# Patient Record
Sex: Male | Born: 1955 | Race: White | Hispanic: No | Marital: Married | Smoking: Never smoker
Health system: Southern US, Community
[De-identification: ages and names within clinical notes are randomized; demographics above are authoritative.]

## PROBLEM LIST (undated history)

## (undated) DIAGNOSIS — K219 Gastro-esophageal reflux disease without esophagitis: Secondary | ICD-10-CM

## (undated) DIAGNOSIS — E785 Hyperlipidemia, unspecified: Secondary | ICD-10-CM

## (undated) DIAGNOSIS — I10 Essential (primary) hypertension: Secondary | ICD-10-CM

## (undated) DIAGNOSIS — I82409 Acute embolism and thrombosis of unspecified deep veins of unspecified lower extremity: Secondary | ICD-10-CM

## (undated) DIAGNOSIS — M1990 Unspecified osteoarthritis, unspecified site: Secondary | ICD-10-CM

## (undated) DIAGNOSIS — I2699 Other pulmonary embolism without acute cor pulmonale: Secondary | ICD-10-CM

## (undated) DIAGNOSIS — G4730 Sleep apnea, unspecified: Secondary | ICD-10-CM

## (undated) DIAGNOSIS — E7800 Pure hypercholesterolemia, unspecified: Secondary | ICD-10-CM

## (undated) HISTORY — DX: Other pulmonary embolism without acute cor pulmonale: I26.99

## (undated) HISTORY — PX: COLONOSCOPY: SHX174

## (undated) HISTORY — PX: CATARACT EXTRACTION: SUR2

## (undated) HISTORY — PX: OTHER SURGICAL HISTORY: SHX169

## (undated) HISTORY — DX: Pure hypercholesterolemia, unspecified: E78.00

## (undated) HISTORY — DX: Morbid (severe) obesity due to excess calories: E66.01

## (undated) HISTORY — PX: WISDOM TOOTH EXTRACTION: SHX21

## (undated) HISTORY — DX: Unspecified osteoarthritis, unspecified site: M19.90

## (undated) HISTORY — DX: Acute embolism and thrombosis of unspecified deep veins of unspecified lower extremity: I82.409

---

## 2013-04-03 DIAGNOSIS — M25551 Pain in right hip: Secondary | ICD-10-CM

## 2013-04-03 HISTORY — DX: Pain in right hip: M25.551

## 2015-03-07 ENCOUNTER — Other Ambulatory Visit: Payer: Self-pay | Admitting: Orthopaedic Surgery

## 2015-03-29 ENCOUNTER — Ambulatory Visit (HOSPITAL_COMMUNITY)
Admission: RE | Admit: 2015-03-29 | Discharge: 2015-03-29 | Disposition: A | Discharge status: Home / Self Care | Payer: BLUE CROSS/BLUE SHIELD | Source: Ambulatory Visit | Attending: Orthopaedic Surgery | Admitting: Orthopaedic Surgery

## 2015-03-29 ENCOUNTER — Encounter (HOSPITAL_COMMUNITY): Payer: Self-pay

## 2015-03-29 ENCOUNTER — Encounter (HOSPITAL_COMMUNITY)
Admission: RE | Admit: 2015-03-29 | Discharge: 2015-03-29 | Disposition: A | Discharge status: Home / Self Care | Payer: BLUE CROSS/BLUE SHIELD | Source: Ambulatory Visit | Attending: Orthopaedic Surgery | Admitting: Orthopaedic Surgery

## 2015-03-29 DIAGNOSIS — Z01810 Encounter for preprocedural cardiovascular examination: Secondary | ICD-10-CM | POA: Diagnosis not present

## 2015-03-29 DIAGNOSIS — Z01818 Encounter for other preprocedural examination: Secondary | ICD-10-CM

## 2015-03-29 DIAGNOSIS — Z01812 Encounter for preprocedural laboratory examination: Secondary | ICD-10-CM | POA: Diagnosis not present

## 2015-03-29 HISTORY — DX: Hyperlipidemia, unspecified: E78.5

## 2015-03-29 HISTORY — DX: Unspecified osteoarthritis, unspecified site: M19.90

## 2015-03-29 HISTORY — DX: Gastro-esophageal reflux disease without esophagitis: K21.9

## 2015-03-29 HISTORY — DX: Essential (primary) hypertension: I10

## 2015-03-29 HISTORY — DX: Sleep apnea, unspecified: G47.30

## 2015-03-29 LAB — BASIC METABOLIC PANEL
Anion gap: 8 (ref 5–15)
BUN: 14 mg/dL (ref 6–20)
CALCIUM: 9.4 mg/dL (ref 8.9–10.3)
CO2: 26 mmol/L (ref 22–32)
CREATININE: 1.03 mg/dL (ref 0.61–1.24)
Chloride: 107 mmol/L (ref 101–111)
GFR calc non Af Amer: >60 mL/min (ref >60)
Glucose, Bld: 98 mg/dL (ref 65–99)
Potassium: 4.2 mmol/L (ref 3.5–5.1)
SODIUM: 141 mmol/L (ref 135–145)

## 2015-03-29 LAB — CBC WITH DIFFERENTIAL/PLATELET
BASOS PCT: 0 %
Basophils Absolute: 0 10*3/uL (ref 0.0–0.1)
EOS ABS: 0.2 10*3/uL (ref 0.0–0.7)
EOS PCT: 4 %
HCT: 39.8 % (ref 39.0–52.0)
HEMOGLOBIN: 12.8 g/dL — AB (ref 13.0–17.0)
Lymphocytes Relative: 35 %
Lymphs Abs: 2.1 10*3/uL (ref 0.7–4.0)
MCH: 28.8 pg (ref 26.0–34.0)
MCHC: 32.2 g/dL (ref 30.0–36.0)
MCV: 89.6 fL (ref 78.0–100.0)
MONOS PCT: 6 %
Monocytes Absolute: 0.4 10*3/uL (ref 0.1–1.0)
NEUTROS PCT: 55 %
Neutro Abs: 3.4 10*3/uL (ref 1.7–7.7)
PLATELETS: 231 10*3/uL (ref 150–400)
RBC: 4.44 MIL/uL (ref 4.22–5.81)
RDW: 13.6 % (ref 11.5–15.5)
WBC: 6.1 10*3/uL (ref 4.0–10.5)

## 2015-03-29 LAB — APTT: aPTT: 29 seconds (ref 24–37)

## 2015-03-29 LAB — SURGICAL PCR SCREEN
MRSA, PCR: NEGATIVE
Staphylococcus aureus: NEGATIVE

## 2015-03-29 LAB — PROTIME-INR
INR: 1.07 (ref 0.00–1.49)
PROTHROMBIN TIME: 14.1 s (ref 11.6–15.2)

## 2015-03-29 LAB — URINALYSIS, ROUTINE W REFLEX MICROSCOPIC
Bilirubin Urine: NEGATIVE
GLUCOSE, UA: NEGATIVE mg/dL
HGB URINE DIPSTICK: NEGATIVE
Ketones, ur: NEGATIVE mg/dL
LEUKOCYTES UA: NEGATIVE
Nitrite: NEGATIVE
PH: 7 (ref 5.0–8.0)
Protein, ur: NEGATIVE mg/dL
SPECIFIC GRAVITY, URINE: 1.023 (ref 1.005–1.030)

## 2015-03-29 LAB — ABO/RH: ABO/RH(D): A POS

## 2015-03-29 NOTE — Progress Notes (Signed)
PCP is Dr [facility] Denies ever seeing a cardiologist. Has never had a stress test, echo, or card cath. [facility] sent Request sent for sleep study- [place] [facility] [place]. Instructed pt to bring in Cpap mask on the day of surgery, voices understanding.

## 2015-03-29 NOTE — Pre-Procedure Instructions (Addendum)
Joseph Bruce  03/29/2015     No Pharmacies Listed   Your procedure is scheduled on Dec20  Report to [facility] Admitting at 800 A.M.  Call this number if you have problems the morning of surgery:  [phone]   Remember:  Do not eat food or drink liquids after midnight.  Take these medicines the morning of surgery with A SIP OF WATER   Stop taking Asprin, Ibuprofen,  Advil, Motrin, BC's, Goody's, Herbal medications, Fish oil   Do not wear jewelry, make-up or nail polish.  Do not wear lotions, powders, or perfumes.  You may wear deodorant.  Do not shave 48 hours prior to surgery.  Men may shave face and neck.  Do not bring valuables to the hospital.  [facility] is not responsible for any belongings or valuables.  Contacts, dentures or bridgework may not be worn into surgery.  Leave your suitcase in the car.  After surgery it may be brought to your room.  For patients admitted to the hospital, discharge time will be determined by your treatment team.  Patients discharged the day of surgery will not be allowed to drive home.    Special instructions: [place] - Preparing for Surgery  Before surgery, you can play an important role.  Because skin is not sterile, your skin needs to be as free of germs as possible.  You can reduce the number of germs on you skin by washing with CHG (chlorahexidine gluconate) soap before surgery.  CHG is an antiseptic cleaner which kills germs and bonds with the skin to continue killing germs even after washing.  Please DO NOT use if you have an allergy to CHG or antibacterial soaps.  If your skin becomes reddened/irritated stop using the CHG and inform your nurse when you arrive at Short Stay.  Do not shave (including legs and underarms) for at least 48 hours prior to the first CHG shower.  You may shave your face.  Please follow these instructions carefully:   1.  Shower with CHG Soap the night before surgery and the   morning  of Surgery.  2.  If you choose to wash your hair, wash your hair first as usual with your normal shampoo.  3.  After you shampoo, rinse your hair and body thoroughly to remove the  Shampoo.  4.  Use CHG as you would any other liquid soap.  You can apply chg directly   to the skin and wash gently with scrungie or a clean washcloth.  5.  Apply the CHG Soap to your body ONLY FROM THE NECK DOWN.     Do not use on open wounds or open sores.  Avoid contact with your eyes,  ears, mouth and genitals (private parts).  Wash genitals (private parts)  with your normal soap.  6.  Wash thoroughly, paying special attention to the area where your surgery   will be performed.  7.  Thoroughly rinse your body with warm water from the neck down.  8.  DO NOT shower/wash with your normal soap after using and rinsing off   the CHG Soap.  9.  Pat yourself dry with a clean towel.            10.  Wear clean pajamas.            11.  Place clean sheets on your bed the night of your first shower and do not sleep with pets.  Day  of Surgery  Do not apply any lotions/deoderants the morning of surgery.  Please wear clean clothes to the hospital/surgery center.     Please read over the following fact sheets that you were given. Pain Booklet, Coughing and Deep Breathing, MRSA Information and Surgical Site Infection Prevention

## 2015-03-30 LAB — TYPE AND SCREEN
ABO/RH(D): A POS
Antibody Screen: NEGATIVE

## 2015-04-03 NOTE — H&P (Signed)
TOTAL HIP ADMISSION H&P  Patient is admitted for right total hip arthroplasty.  Subjective:  Chief Complaint: right hip pain  HPI: Joseph Bruce, 59 y.o. male, has a history of pain and functional disability in the right hip(s) due to arthritis and patient has failed non-surgical conservative treatments for greater than 12 weeks to include NSAID's and/or analgesics, corticosteriod injections, flexibility and strengthening excercises, use of assistive devices, weight reduction as appropriate and activity modification.  Onset of symptoms was gradual starting 5 years ago with gradually worsening course since that time.The patient noted no past surgery on the right hip(s).  Patient currently rates pain in the right hip at 10 out of 10 with activity. Patient has night pain, worsening of pain with activity and weight bearing, trendelenberg gait, pain that interfers with activities of daily living and crepitus. Patient has evidence of subchondral cysts, subchondral sclerosis, periarticular osteophytes and joint space narrowing by imaging studies. This condition presents safety issues increasing the risk of falls. There is no current active infection.  There are no active problems to display for this patient.  Past Medical History  Diagnosis Date  . Hypertension   . Serum lipids high   . Sleep apnea   . Arthritis   . GERD (gastroesophageal reflux disease)     ulcer    Past Surgical History  Procedure Laterality Date  . Colonoscopy    . Wisdom tooth extraction      No prescriptions prior to admission   No Known Allergies  Social History  Substance Use Topics  . Smoking status: Never Smoker   . Smokeless tobacco: Not on file  . Alcohol Use: Yes     Comment: occ    No family history on file.   Review of Systems  Musculoskeletal: Positive for joint pain.       Right hip  All other systems reviewed and are negative.   Objective:  Physical Exam  Constitutional: He is oriented to  person, place, and time. He appears well-developed and well-nourished.  HENT:  Head: Normocephalic and atraumatic.  Eyes: Pupils are equal, round, and reactive to light.  Neck: Normal range of motion.  Cardiovascular: Normal rate and regular rhythm.   Respiratory: Effort normal.  GI: Soft.  Musculoskeletal:  Right hip motion is quite limited. He has pain on internal and external rotation. Leg lengths are roughly equal. Sensation and motor function are intact in his feet with palpable pulses on both sides. There is no palpable lymphadenopathy about his groin.  Neurological: He is alert and oriented to person, place, and time.  Skin: Skin is warm and dry.  Psychiatric: He has a normal mood and affect. His behavior is normal. Judgment and thought content normal.    Vital signs in last 24 hours:    Labs:   There is no height or weight on file to calculate BMI.   Imaging Review Plain radiographs demonstrate severe degenerative joint disease of the right hip(s). The bone quality appears to be good for age and reported activity level.  Assessment/Plan:  End stage primary arthritis, right hip(s)  The patient history, physical examination, clinical judgement of the provider and imaging studies are consistent with end stage degenerative joint disease of the right hip(s) and total hip arthroplasty is deemed medically necessary. The treatment options including medical management, injection therapy, arthroscopy and arthroplasty were discussed at length. The risks and benefits of total hip arthroplasty were presented and reviewed. The risks due to aseptic loosening, infection,  stiffness, dislocation/subluxation,  thromboembolic complications and other imponderables were discussed.  The patient acknowledged the explanation, agreed to proceed with the plan and consent was signed. Patient is being admitted for inpatient treatment for surgery, pain control, PT, OT, prophylactic antibiotics, VTE  prophylaxis, progressive ambulation and ADL's and discharge planning.The patient is planning to be discharged to skilled nursing facility

## 2015-04-08 MED ORDER — DEXTROSE 5 % IV SOLN
3.0000 g | INTRAVENOUS | Status: AC
  Administered 2015-04-09: 3 g via INTRAVENOUS
  Filled 2015-04-08: qty 3000

## 2015-04-08 MED ORDER — LACTATED RINGERS IV SOLN
INTRAVENOUS | Status: DC
  Administered 2015-04-09 (×4): via INTRAVENOUS

## 2015-04-08 MED ORDER — CHLORHEXIDINE GLUCONATE 4 % EX LIQD: 60.0000 mL | Freq: Once | CUTANEOUS | Status: DC

## 2015-04-09 ENCOUNTER — Inpatient Hospital Stay (HOSPITAL_COMMUNITY): Payer: BLUE CROSS/BLUE SHIELD | Admitting: Anesthesiology

## 2015-04-09 ENCOUNTER — Inpatient Hospital Stay (HOSPITAL_COMMUNITY): Payer: BLUE CROSS/BLUE SHIELD

## 2015-04-09 ENCOUNTER — Inpatient Hospital Stay (HOSPITAL_COMMUNITY)
Admission: RE | Admit: 2015-04-09 | Discharge: 2015-04-12 | DRG: 470 | Disposition: A | Discharge status: Skilled Nursing Facility | Payer: BLUE CROSS/BLUE SHIELD | Source: Ambulatory Visit | Attending: Orthopaedic Surgery | Admitting: Orthopaedic Surgery

## 2015-04-09 ENCOUNTER — Encounter (HOSPITAL_COMMUNITY)
Admission: RE | Disposition: A | Discharge status: Skilled Nursing Facility | Payer: Self-pay | Source: Ambulatory Visit | Attending: Orthopaedic Surgery

## 2015-04-09 DIAGNOSIS — M1611 Unilateral primary osteoarthritis, right hip: Principal | ICD-10-CM | POA: Diagnosis present

## 2015-04-09 DIAGNOSIS — G4730 Sleep apnea, unspecified: Secondary | ICD-10-CM | POA: Diagnosis present

## 2015-04-09 DIAGNOSIS — K219 Gastro-esophageal reflux disease without esophagitis: Secondary | ICD-10-CM | POA: Diagnosis present

## 2015-04-09 DIAGNOSIS — Z7982 Long term (current) use of aspirin: Secondary | ICD-10-CM

## 2015-04-09 DIAGNOSIS — N5089 Other specified disorders of the male genital organs: Secondary | ICD-10-CM | POA: Diagnosis present

## 2015-04-09 DIAGNOSIS — I10 Essential (primary) hypertension: Secondary | ICD-10-CM | POA: Diagnosis present

## 2015-04-09 DIAGNOSIS — Z419 Encounter for procedure for purposes other than remedying health state, unspecified: Secondary | ICD-10-CM

## 2015-04-09 DIAGNOSIS — E669 Obesity, unspecified: Secondary | ICD-10-CM | POA: Diagnosis present

## 2015-04-09 DIAGNOSIS — Z79899 Other long term (current) drug therapy: Secondary | ICD-10-CM | POA: Diagnosis not present

## 2015-04-09 DIAGNOSIS — Z6841 Body Mass Index (BMI) 40.0 and over, adult: Secondary | ICD-10-CM

## 2015-04-09 HISTORY — PX: TOTAL HIP ARTHROPLASTY: SHX124

## 2015-04-09 HISTORY — DX: Obesity, unspecified: E66.9

## 2015-04-09 HISTORY — DX: Unilateral primary osteoarthritis, right hip: M16.11

## 2015-04-09 SURGERY — ARTHROPLASTY, HIP, TOTAL, ANTERIOR APPROACH
Anesthesia: Spinal | Laterality: Right

## 2015-04-09 MED ORDER — PHENOL 1.4 % MT LIQD: 1.0000 | OROMUCOSAL | Status: DC | PRN

## 2015-04-09 MED ORDER — CEFAZOLIN SODIUM-DEXTROSE 2-3 GM-% IV SOLR
2.0000 g | Freq: Four times a day (QID) | INTRAVENOUS | Status: AC
  Administered 2015-04-09 – 2015-04-10 (×2): 2 g via INTRAVENOUS
  Filled 2015-04-09 (×2): qty 50

## 2015-04-09 MED ORDER — BUPIVACAINE-EPINEPHRINE (PF) 0.5% -1:200000 IJ SOLN
INTRAMUSCULAR | Status: AC
  Filled 2015-04-09: qty 60

## 2015-04-09 MED ORDER — 0.9 % SODIUM CHLORIDE (POUR BTL) OPTIME
TOPICAL | Status: DC | PRN
Start: 2015-04-09 — End: 2015-04-09
  Administered 2015-04-09: 1000 mL

## 2015-04-09 MED ORDER — PROPOFOL 500 MG/50ML IV EMUL
INTRAVENOUS | Status: DC | PRN
  Administered 2015-04-09: 25 ug/kg/min via INTRAVENOUS

## 2015-04-09 MED ORDER — BISACODYL 5 MG PO TBEC: 5.0000 mg | DELAYED_RELEASE_TABLET | Freq: Every day | ORAL | Status: DC | PRN

## 2015-04-09 MED ORDER — PROPOFOL 500 MG/50ML IV EMUL: INTRAVENOUS | Status: DC | PRN

## 2015-04-09 MED ORDER — METOCLOPRAMIDE HCL 5 MG/ML IJ SOLN: 5.0000 mg | Freq: Three times a day (TID) | INTRAMUSCULAR | Status: DC | PRN

## 2015-04-09 MED ORDER — DOCUSATE SODIUM 100 MG PO CAPS
100.0000 mg | ORAL_CAPSULE | Freq: Two times a day (BID) | ORAL | Status: DC
  Administered 2015-04-09 – 2015-04-12 (×6): 100 mg via ORAL
  Filled 2015-04-09 (×6): qty 1

## 2015-04-09 MED ORDER — DEXTROSE 5 % IV SOLN
500.0000 mg | Freq: Four times a day (QID) | INTRAVENOUS | Status: DC | PRN
  Filled 2015-04-09: qty 5

## 2015-04-09 MED ORDER — ACETAMINOPHEN 325 MG PO TABS
650.0000 mg | ORAL_TABLET | Freq: Four times a day (QID) | ORAL | Status: DC | PRN
  Administered 2015-04-11: 650 mg via ORAL
  Filled 2015-04-09: qty 2

## 2015-04-09 MED ORDER — ALUM & MAG HYDROXIDE-SIMETH 200-200-20 MG/5ML PO SUSP
30.0000 mL | ORAL | Status: DC | PRN
  Administered 2015-04-11: 30 mL via ORAL
  Filled 2015-04-09: qty 30

## 2015-04-09 MED ORDER — SIMVASTATIN 20 MG PO TABS
20.0000 mg | ORAL_TABLET | Freq: Every day | ORAL | Status: DC
  Administered 2015-04-09 – 2015-04-11 (×3): 20 mg via ORAL
  Filled 2015-04-09 (×3): qty 1

## 2015-04-09 MED ORDER — MIDAZOLAM HCL 5 MG/5ML IJ SOLN
INTRAMUSCULAR | Status: DC | PRN
  Administered 2015-04-09 (×2): 1 mg via INTRAVENOUS

## 2015-04-09 MED ORDER — HYDROMORPHONE HCL 1 MG/ML IJ SOLN
0.2500 mg | INTRAMUSCULAR | Status: DC | PRN
  Administered 2015-04-09 (×5): 0.5 mg via INTRAVENOUS

## 2015-04-09 MED ORDER — BUPIVACAINE HCL (PF) 0.5 % IJ SOLN
INTRAMUSCULAR | Status: DC | PRN
  Administered 2015-04-09: 3 mL via INTRATHECAL

## 2015-04-09 MED ORDER — METOCLOPRAMIDE HCL 5 MG PO TABS: 5.0000 mg | ORAL_TABLET | Freq: Three times a day (TID) | ORAL | Status: DC | PRN

## 2015-04-09 MED ORDER — DIPHENHYDRAMINE HCL 12.5 MG/5ML PO ELIX: 12.5000 mg | ORAL_SOLUTION | ORAL | Status: DC | PRN

## 2015-04-09 MED ORDER — PROMETHAZINE HCL 25 MG/ML IJ SOLN
6.2500 mg | INTRAMUSCULAR | Status: DC | PRN
  Administered 2015-04-09: 6.25 mg via INTRAVENOUS

## 2015-04-09 MED ORDER — LACTATED RINGERS IV SOLN: INTRAVENOUS | Status: DC

## 2015-04-09 MED ORDER — PANTOPRAZOLE SODIUM 20 MG PO TBEC
20.0000 mg | DELAYED_RELEASE_TABLET | Freq: Every day | ORAL | Status: DC
  Administered 2015-04-10 – 2015-04-12 (×3): 20 mg via ORAL
  Filled 2015-04-09 (×4): qty 1

## 2015-04-09 MED ORDER — HYDROMORPHONE HCL 1 MG/ML IJ SOLN
INTRAMUSCULAR | Status: AC
  Filled 2015-04-09: qty 1

## 2015-04-09 MED ORDER — BUPIVACAINE LIPOSOME 1.3 % IJ SUSP
20.0000 mL | INTRAMUSCULAR | Status: DC
  Filled 2015-04-09: qty 20

## 2015-04-09 MED ORDER — MIDAZOLAM HCL 2 MG/2ML IJ SOLN
INTRAMUSCULAR | Status: AC
  Filled 2015-04-09: qty 2

## 2015-04-09 MED ORDER — SODIUM CHLORIDE 0.9 % IV SOLN
1000.0000 mg | INTRAVENOUS | Status: AC
  Administered 2015-04-09: 1000 mg via INTRAVENOUS
  Filled 2015-04-09: qty 10

## 2015-04-09 MED ORDER — HYDROCODONE-ACETAMINOPHEN 5-325 MG PO TABS
1.0000 | ORAL_TABLET | ORAL | Status: DC | PRN
  Administered 2015-04-09 – 2015-04-11 (×9): 2 via ORAL
  Administered 2015-04-11: 1 via ORAL
  Administered 2015-04-11 – 2015-04-12 (×2): 2 via ORAL
  Filled 2015-04-09 (×8): qty 2
  Filled 2015-04-09: qty 1
  Filled 2015-04-09 (×2): qty 2

## 2015-04-09 MED ORDER — HYDROMORPHONE HCL 1 MG/ML IJ SOLN: 0.5000 mg | INTRAMUSCULAR | Status: DC | PRN

## 2015-04-09 MED ORDER — ASPIRIN EC 325 MG PO TBEC
325.0000 mg | DELAYED_RELEASE_TABLET | Freq: Two times a day (BID) | ORAL | Status: DC
  Administered 2015-04-10 – 2015-04-12 (×5): 325 mg via ORAL
  Filled 2015-04-09 (×6): qty 1

## 2015-04-09 MED ORDER — LISINOPRIL 10 MG PO TABS
10.0000 mg | ORAL_TABLET | Freq: Every day | ORAL | Status: DC
  Administered 2015-04-10 – 2015-04-12 (×2): 10 mg via ORAL
  Filled 2015-04-09 (×3): qty 1

## 2015-04-09 MED ORDER — METHOCARBAMOL 500 MG PO TABS
500.0000 mg | ORAL_TABLET | Freq: Four times a day (QID) | ORAL | Status: DC | PRN
  Administered 2015-04-09 – 2015-04-12 (×6): 500 mg via ORAL
  Filled 2015-04-09 (×5): qty 1

## 2015-04-09 MED ORDER — MENTHOL 3 MG MT LOZG: 1.0000 | LOZENGE | OROMUCOSAL | Status: DC | PRN

## 2015-04-09 MED ORDER — HYDROCODONE-ACETAMINOPHEN 5-325 MG PO TABS
ORAL_TABLET | ORAL | Status: AC
  Filled 2015-04-09: qty 2

## 2015-04-09 MED ORDER — FENTANYL CITRATE (PF) 100 MCG/2ML IJ SOLN
INTRAMUSCULAR | Status: DC | PRN
  Administered 2015-04-09 (×2): 50 ug via INTRAVENOUS
  Administered 2015-04-09 (×6): 25 ug via INTRAVENOUS

## 2015-04-09 MED ORDER — EPHEDRINE SULFATE 50 MG/ML IJ SOLN
INTRAMUSCULAR | Status: DC | PRN
  Administered 2015-04-09: 5 mg via INTRAVENOUS

## 2015-04-09 MED ORDER — ONDANSETRON HCL 4 MG PO TABS: 4.0000 mg | ORAL_TABLET | Freq: Four times a day (QID) | ORAL | Status: DC | PRN

## 2015-04-09 MED ORDER — ONDANSETRON HCL 4 MG/2ML IJ SOLN: 4.0000 mg | Freq: Four times a day (QID) | INTRAMUSCULAR | Status: DC | PRN

## 2015-04-09 MED ORDER — PROPOFOL 10 MG/ML IV BOLUS
INTRAVENOUS | Status: AC
  Filled 2015-04-09: qty 20

## 2015-04-09 MED ORDER — METHOCARBAMOL 500 MG PO TABS
ORAL_TABLET | ORAL | Status: AC
  Filled 2015-04-09: qty 1

## 2015-04-09 MED ORDER — PHENYLEPHRINE HCL 10 MG/ML IJ SOLN
INTRAMUSCULAR | Status: DC | PRN
  Administered 2015-04-09: 80 ug via INTRAVENOUS

## 2015-04-09 MED ORDER — ACETAMINOPHEN 650 MG RE SUPP: 650.0000 mg | Freq: Four times a day (QID) | RECTAL | Status: DC | PRN

## 2015-04-09 MED ORDER — FENTANYL CITRATE (PF) 250 MCG/5ML IJ SOLN
INTRAMUSCULAR | Status: AC
  Filled 2015-04-09: qty 5

## 2015-04-09 MED ORDER — PROMETHAZINE HCL 25 MG/ML IJ SOLN
INTRAMUSCULAR | Status: AC
  Filled 2015-04-09: qty 1

## 2015-04-09 MED ORDER — ONDANSETRON HCL 4 MG/2ML IJ SOLN
INTRAMUSCULAR | Status: DC | PRN
  Administered 2015-04-09: 4 mg via INTRAVENOUS

## 2015-04-09 SURGICAL SUPPLY — 57 items
BLADE SAW SGTL 18X1.27X75 (BLADE) ×2 IMPLANT
BLADE SAW SGTL 18X1.27X75MM (BLADE) ×1
BLADE SURG ROTATE 9660 (MISCELLANEOUS) IMPLANT
CELLS DAT CNTRL 66122 CELL SVR (MISCELLANEOUS) ×1 IMPLANT
CLOSURE WOUND 1/2 X4 (GAUZE/BANDAGES/DRESSINGS) ×1
COVER PERINEAL POST (MISCELLANEOUS) ×3 IMPLANT
COVER SURGICAL LIGHT HANDLE (MISCELLANEOUS) ×3 IMPLANT
DRAPE C-ARM 42X72 X-RAY (DRAPES) ×3 IMPLANT
DRAPE IMP U-DRAPE 54X76 (DRAPES) ×3 IMPLANT
DRAPE STERI IOBAN 125X83 (DRAPES) ×3 IMPLANT
DRAPE U-SHAPE 47X51 STRL (DRAPES) ×9 IMPLANT
DRSG AQUACEL AG ADV 3.5X10 (GAUZE/BANDAGES/DRESSINGS) ×3 IMPLANT
DURAPREP 26ML APPLICATOR (WOUND CARE) ×3 IMPLANT
ELECT CAUTERY BLADE 6.4 (BLADE) ×3 IMPLANT
ELECT REM PT RETURN 9FT ADLT (ELECTROSURGICAL) ×3
ELECTRODE REM PT RTRN 9FT ADLT (ELECTROSURGICAL) ×1 IMPLANT
FACESHIELD WRAPAROUND (MASK) ×6 IMPLANT
GLOVE BIO SURGEON STRL SZ8 (GLOVE) ×6 IMPLANT
GLOVE BIOGEL PI IND STRL 8 (GLOVE) ×2 IMPLANT
GLOVE BIOGEL PI INDICATOR 8 (GLOVE) ×4
GLOVE SS BIOGEL STRL SZ 8.5 (GLOVE) ×1 IMPLANT
GLOVE SUPERSENSE BIOGEL SZ 8.5 (GLOVE) ×2
GLOVE SURG SYN 7.5  E (GLOVE) ×4
GLOVE SURG SYN 7.5 E (GLOVE) ×2 IMPLANT
GOWN STRL REIN 3XL LVL4 (GOWN DISPOSABLE) ×3 IMPLANT
GOWN STRL REUS W/ TWL LRG LVL3 (GOWN DISPOSABLE) ×1 IMPLANT
GOWN STRL REUS W/ TWL XL LVL3 (GOWN DISPOSABLE) ×2 IMPLANT
GOWN STRL REUS W/TWL LRG LVL3 (GOWN DISPOSABLE) ×2
GOWN STRL REUS W/TWL XL LVL3 (GOWN DISPOSABLE) ×4
KIT BASIN OR (CUSTOM PROCEDURE TRAY) ×3 IMPLANT
KIT ROOM TURNOVER OR (KITS) ×3 IMPLANT
LINER BOOT UNIVERSAL DISP (MISCELLANEOUS) ×3 IMPLANT
LINER NEUTRAL 54X36MM PLUS 4 (Hips) ×3 IMPLANT
MANIFOLD NEPTUNE II (INSTRUMENTS) ×3 IMPLANT
NS IRRIG 1000ML POUR BTL (IV SOLUTION) ×3 IMPLANT
PACK TOTAL JOINT (CUSTOM PROCEDURE TRAY) ×3 IMPLANT
PACK UNIVERSAL I (CUSTOM PROCEDURE TRAY) ×3 IMPLANT
PAD ARMBOARD 7.5X6 YLW CONV (MISCELLANEOUS) ×6 IMPLANT
RTRCTR WOUND ALEXIS 18CM MED (MISCELLANEOUS) ×3
SEALER BIPOLAR AQUA 6.0 (INSTRUMENTS) ×3 IMPLANT
STAPLER VISISTAT 35W (STAPLE) ×3 IMPLANT
STRIP CLOSURE SKIN 1/2X4 (GAUZE/BANDAGES/DRESSINGS) ×2 IMPLANT
SUT ETHIBOND NAB CT1 #1 30IN (SUTURE) ×9 IMPLANT
SUT VIC AB 0 CT1 27 (SUTURE)
SUT VIC AB 0 CT1 27XBRD ANBCTR (SUTURE) IMPLANT
SUT VIC AB 1 CT1 27 (SUTURE) ×2
SUT VIC AB 1 CT1 27XBRD ANBCTR (SUTURE) ×1 IMPLANT
SUT VIC AB 2-0 CT1 27 (SUTURE) ×2
SUT VIC AB 2-0 CT1 TAPERPNT 27 (SUTURE) ×1 IMPLANT
SUT VLOC 180 0 24IN GS25 (SUTURE) ×3 IMPLANT
TOWEL OR 17X24 6PK STRL BLUE (TOWEL DISPOSABLE) ×3 IMPLANT
TOWEL OR 17X26 10 PK STRL BLUE (TOWEL DISPOSABLE) ×6 IMPLANT
TRAY FOLEY CATH 14FR (SET/KITS/TRAYS/PACK) IMPLANT
TUBE CONNECTING 20'X1/4 (TUBING) ×1
TUBE CONNECTING 20X1/4 (TUBING) ×2 IMPLANT
WATER STERILE IRR 1000ML POUR (IV SOLUTION) ×6 IMPLANT
YANKAUER SUCT BULB TIP NO VENT (SUCTIONS) ×3 IMPLANT

## 2015-04-09 NOTE — Interval H&P Note (Signed)
OK for surgery PD 

## 2015-04-09 NOTE — Anesthesia Preprocedure Evaluation (Signed)
Anesthesia Evaluation  Patient identified by MRN, date of birth, ID band Patient awake    Reviewed: Allergy & Precautions, NPO status , Patient's Chart, lab work & pertinent test results  Airway Mallampati: II  TM Distance: >3 FB Neck ROM: Full    Dental no notable dental hx.    Pulmonary neg pulmonary ROS,    Pulmonary exam normal breath sounds clear to auscultation       Cardiovascular hypertension, Pt. on medications Normal cardiovascular exam Rhythm:Regular Rate:Normal     Neuro/Psych negative neurological ROS  negative psych ROS   GI/Hepatic negative GI ROS, Neg liver ROS,   Endo/Other  Morbid obesity  Renal/GU negative Renal ROS  negative genitourinary   Musculoskeletal negative musculoskeletal ROS (+)   Abdominal   Peds negative pediatric ROS (+)  Hematology negative hematology ROS (+)   Anesthesia Other Findings   Reproductive/Obstetrics negative OB ROS                             Anesthesia Physical Anesthesia Plan  ASA: III  Anesthesia Plan: Spinal   Post-op Pain Management:    Induction: Intravenous  Airway Management Planned: Simple Face Mask  Additional Equipment:   Intra-op Plan:   Post-operative Plan:   Informed Consent: I have reviewed the patients History and Physical, chart, labs and discussed the procedure including the risks, benefits and alternatives for the proposed anesthesia with the patient or authorized representative who has indicated his/her understanding and acceptance.   Dental advisory given  Plan Discussed with: CRNA and Surgeon  Anesthesia Plan Comments:         Anesthesia Quick Evaluation

## 2015-04-09 NOTE — Progress Notes (Signed)
RT note: Pt. set up on CPAP @ 14 cmh20 per home setting, own FFM was not adaptable due to piece missing, [facility], 02 bled in @ 2 lpm, tolerating well, Rt to monitor.

## 2015-04-09 NOTE — Op Note (Signed)
PRE-OP DIAGNOSIS:  RIGHT HIP DEGENERATIVE JOINT DISEASE POST-OP DIAGNOSIS:  same PROCEDURE: RIGHT TOTAL HIP ARTHROPLASTY ANTERIOR APPROACH ANESTHESIA:  Spinal SURGEON:  Marcene CorningPeter Marynell Bies MD ASSISTANT:  Elodia FlorenceAndrew Nida PA-C   INDICATIONS FOR PROCEDURE:  The patient is a 59 y.o. male with a long history of a painful hip.  This has persisted despite multiple conservative measures.  The patient has persisted with pain and dysfunction making rest and activity difficult.  A total hip replacement is offered as surgical treatment.  Informed operative consent was obtained after discussion of possible complications including reaction to anesthesia, infection, neurovascular injury, dislocation, DVT, PE, and death.  The importance of the postoperative rehab program to optimize result was stressed with the patient.  SUMMARY OF FINDINGS AND PROCEDURE:  Under general anesthesia through a anterior approach an the Hana table a right THR was performed.  The patient had severe degenerative change and excellent bone quality.  We used DePuy components to replace the hip and these were size KA16 Corail femur capped with a +12 36mm ceramic hip ball.  On the acetabular side we used a size 54 Gription shell with a  plus 4 neutral polyethylene liner.  We did use a hole eliminator.  Elodia FlorenceAndrew Nida PA-C assisted throughout and was invaluable to the completion of the case in that he helped position and retract while I performed the procedure.  He also closed simultaneously to help minimize OR time.  I used fluoroscopy throughout the case to check position of implants and leg lengths and read all of these views myself.  DESCRIPTION OF PROCEDURE:  The patient was taken to the OR suite where general anesthetic was applied.  The patient was then positioned on the Hana table supine.  All bony prominences were appropriately padded.  Prep and drape was then performed in normal sterile fashion.  The patient was given kefzol preoperative antibiotic  and an appropriate time out was performed.  We then took an anterior approach to the right hip.  Dissection was taken through adipose to the tensor fascia lata fascia.  This structure was incised longitudinally and we dissected in the intermuscular interval just medial to this muscle.  Cobra retractors were placed superior and inferior to the femoral neck superficial to the capsule.  A capsular incision was then made and the retractors were placed along the femoral neck.  Xray was brought in to get a good level for the femoral neck cut which was made with an oscillating saw and osteotome.  The femoral head was removed with a corkscrew.  The acetabulum was exposed and some labral tissues were excised. Reaming was taken to the inside wall of the pelvis and sequentially up to 1 mm smaller than the actual component.  A trial of components was done and then the aforementioned acetabular shell was placed in appropriate tilt and anteversion confirmed by fluoroscopy. The liner was placed along with the hole eliminator and attention was turned to the femur.  The leg was brought down and over into adduction and the elevator bar was used to raise the femur up gently in the wound.  The piriformis was released with care taken to preserve the obturator internus attachment and all of the posterior capsule. The femur was reamed and then broached to the appropriate size.  A trial reduction was done and the aforementioned head and neck assembly gave us the best stability in extension with external rotation.  Leg lengths were felt to be about equal by fluoroscopic exam.  The trial components were removed and the wound irrigated.  We then placed the femoral component in appropriate anteversion.  The head was applied to a dry stem neck and the hip again reduced.  It was again stable in the aforementioned position.  The would was irrigated again followed by re-approximation of anterior capsule with ethibond suture. Tensor fascia was  repaired with V-loc suture  followed by subcutaneous closure with #O and #2 undyed vicryl.  Skin was closed with subQ stitch and steristrips followed by a sterile dressing.  EBL and IOF can be obtained from anesthesia records.  DISPOSITION:  The patient was extubated in the OR and taken to PACU in stable condition to be admitted to the Orthopedic Surgery for appropriate post-op care to include perioperative antibiotics and DVT prophylaxis.

## 2015-04-09 NOTE — Anesthesia Procedure Notes (Signed)
Spinal Patient location during procedure: OR Staffing Performed by: anesthesiologist  Preanesthetic Checklist Completed: patient identified, site marked, surgical consent, pre-op evaluation, timeout performed, IV checked, risks and benefits discussed and monitors and equipment checked Spinal Block Patient position: sitting Prep: Betadine Patient monitoring: heart rate, continuous pulse ox and blood pressure Injection technique: single-shot Needle Needle type: Sprotte  Needle gauge: 22 G Needle length: 12.7 cm Additional Notes Expiration date of kit checked and confirmed. Patient tolerated procedure well, without complications.  Attempt x 2     

## 2015-04-09 NOTE — Transfer of Care (Signed)
Immediate Anesthesia Transfer of Care Note  Patient: Joseph Bruce  Procedure(s) Performed: Procedure(s): TOTAL HIP ARTHROPLASTY ANTERIOR APPROACH (Right)  Patient Location: PACU  Anesthesia Type:MAC and Spinal  Level of Consciousness: awake, alert  and oriented  Airway & Oxygen Therapy: Patient Spontanous Breathing and Patient connected to face mask oxygen  Post-op Assessment: Report given to RN and Post -op Vital signs reviewed and stable  Post vital signs: Reviewed and stable  Last Vitals:  Filed Vitals:   04/09/15 1531 04/09/15 1536  BP:  91/76  Pulse:  73  Temp: 36.4 C   Resp:  15    Complications: No apparent anesthesia complications

## 2015-04-09 NOTE — Anesthesia Postprocedure Evaluation (Signed)
Anesthesia Post Note  Patient: Joseph Bruce  Procedure(s) Performed: Procedure(s) (LRB): TOTAL HIP ARTHROPLASTY ANTERIOR APPROACH (Right)  Patient location during evaluation: PACU Anesthesia Type: Spinal Level of consciousness: sedated, patient cooperative and oriented Pain management: pain level controlled Vital Signs Assessment: post-procedure vital signs reviewed and stable Respiratory status: spontaneous breathing, nonlabored ventilation, respiratory function stable and patient connected to nasal cannula oxygen Cardiovascular status: blood pressure returned to baseline and stable Postop Assessment: no headache, no backache, spinal receding and patient able to bend at knees    Last Vitals:  Filed Vitals:   04/09/15 1735 04/09/15 1741  BP: 131/68   Pulse: 80   Temp:  36.2 C  Resp: 15     Last Pain:  Filed Vitals:   04/09/15 1743  PainSc: Asleep                 Emilee Market,E. Libia Fazzini

## 2015-04-10 ENCOUNTER — Encounter (HOSPITAL_COMMUNITY): Payer: Self-pay | Admitting: *Deleted

## 2015-04-10 LAB — BASIC METABOLIC PANEL
ANION GAP: 10 (ref 5–15)
BUN: 19 mg/dL (ref 6–20)
CALCIUM: 8.5 mg/dL — AB (ref 8.9–10.3)
CO2: 23 mmol/L (ref 22–32)
Chloride: 103 mmol/L (ref 101–111)
Creatinine, Ser: 1.3 mg/dL — ABNORMAL HIGH (ref 0.61–1.24)
GFR calc Af Amer: >60 mL/min (ref >60)
GFR, EST NON AFRICAN AMERICAN: 59 mL/min — AB (ref >60)
GLUCOSE: 177 mg/dL — AB (ref 65–99)
POTASSIUM: 4.5 mmol/L (ref 3.5–5.1)
Sodium: 136 mmol/L (ref 135–145)

## 2015-04-10 LAB — POCT I-STAT EG7
ACID-BASE EXCESS: 1 mmol/L (ref 0.0–2.0)
BICARBONATE: 26.1 meq/L — AB (ref 20.0–24.0)
CALCIUM ION: 1.21 mmol/L (ref 1.12–1.23)
HCT: 37 % — ABNORMAL LOW (ref 39.0–52.0)
Hemoglobin: 12.6 g/dL — ABNORMAL LOW (ref 13.0–17.0)
O2 SAT: 79 %
PH VEN: 7.404 — AB (ref 7.250–7.300)
Potassium: 4.9 mmol/L (ref 3.5–5.1)
SODIUM: 140 mmol/L (ref 135–145)
TCO2: 27 mmol/L (ref 0–100)
pCO2, Ven: 41.4 mmHg — ABNORMAL LOW (ref 45.0–50.0)
pO2, Ven: 41 mmHg (ref 30.0–45.0)

## 2015-04-10 LAB — CBC
HCT: 32.2 % — ABNORMAL LOW (ref 39.0–52.0)
Hemoglobin: 10.6 g/dL — ABNORMAL LOW (ref 13.0–17.0)
MCH: 29.5 pg (ref 26.0–34.0)
MCHC: 32.9 g/dL (ref 30.0–36.0)
MCV: 89.7 fL (ref 78.0–100.0)
PLATELETS: 176 10*3/uL (ref 150–400)
RBC: 3.59 MIL/uL — AB (ref 4.22–5.81)
RDW: 13.8 % (ref 11.5–15.5)
WBC: 9.5 10*3/uL (ref 4.0–10.5)

## 2015-04-10 NOTE — Progress Notes (Signed)
Subjective: 1 Day Post-Op Procedure(s) (LRB): TOTAL HIP ARTHROPLASTY ANTERIOR APPROACH (Right)  Activity level:  wbat Diet tolerance:  ok Voiding:  ok Patient reports pain as mild.    Objective: Vital signs in last 24 hours: Temp:  [97.2 F (36.2 C)-99.8 F (37.7 C)] 98.5 F (36.9 C) (12/21 0435) Pulse Rate:  [60-112] 112 (12/21 0435) Resp:  [7-34] 19 (12/21 0435) BP: (91-150)/(39-76) 131/63 mmHg (12/21 0435) SpO2:  [91 %-100 %] 91 % (12/21 0435) Weight:  [151.5 kg (334 lb)-151.547 kg (334 lb 1.6 oz)] 151.5 kg (334 lb) (12/21 0200)  Labs:  Recent Labs  04/09/15 1422 04/10/15 0529  HGB 12.6* 10.6*    Recent Labs  04/09/15 1422 04/10/15 0529  WBC  --  9.5  RBC  --  3.59*  HCT 37.0* 32.2*  PLT  --  176    Recent Labs  04/09/15 1422 04/10/15 0529  NA 140 136  K 4.9 4.5  CL  --  103  CO2  --  23  BUN  --  19  CREATININE  --  1.30*  GLUCOSE  --  177*  CALCIUM  --  8.5*   No results for input(s): LABPT, INR in the last 72 hours.  Physical Exam:  Neurologically intact ABD soft Neurovascular intact Sensation intact distally Intact pulses distally Dorsiflexion/Plantar flexion intact Incision: dressing C/D/I and no drainage No cellulitis present Compartment soft  Assessment/Plan:  1 Day Post-Op Procedure(s) (LRB): TOTAL HIP ARTHROPLASTY ANTERIOR APPROACH (Right) Advance diet Up with therapy D/C IV fluids Plan for discharge tomorrow Discharge to SNF if doing well and cleared by PT. Follow up in office 2 weeks post op. continue on ASA 325mg  BID x 4 weeks post op.  Randal Goens, Ginger OrganNDREW PAUL 04/10/2015, 8:07 AM

## 2015-04-10 NOTE — Evaluation (Signed)
Physical Therapy Evaluation Patient Details Name: Joseph Bruce MRN: [id number] DOB: 09-Dec-1955 Today's Date: 04/10/2015   History of Present Illness  59 y.o. male [facility] for elective R direct anterior THA.  Pt with significant PMHx of HTN.    Clinical Impression  Pt is POD #1 and is limited by pain, lightheadedness and nausea today.  He was able to ambulate short, in-room distances with RW and is up now in the recliner chair.  He has no assist at home at discharge and will likely need SNF level rehab before returning home alone.   PT to follow acutely for deficits listed below.       Follow Up Recommendations SNF    Equipment Recommendations  Rolling walker with 5" wheels;3in1 (PT)    Recommendations for Other Services   NA    Precautions / Restrictions Precautions Precautions: None Restrictions Weight Bearing Restrictions: Yes RLE Weight Bearing: Weight bearing as tolerated      Mobility  Bed Mobility Overal bed mobility: Needs Assistance Bed Mobility: Supine to Sit     Supine to sit: Mod assist;HOB elevated     General bed mobility comments: Mod assist to support trunk from behind so he could get to sitting.  Verbal cues for sequencing and hand placement.  HOB elevated to assist with transitions.   Transfers Overall transfer level: Needs assistance Equipment used: Rolling walker (2 wheeled) Transfers: Sit to/from Stand Sit to Stand: Min assist;From elevated surface         General transfer comment: Min assist to stand from maximally elevated bed.  Pt relying heavily on bil arms for support. Verbal cues for safe hand placement, but pt still pushing up with both hands on RW.  Assist needed to stablize walker during transitions and support trunk for balance.  Significant uncontrolled descent to sit in lower recliner chair (although built up as much as possible to make it higher for him.   Ambulation/Gait Ambulation/Gait assistance: Min  assist Ambulation Distance (Feet): 20 Feet Assistive device: Rolling walker (2 wheeled) Gait Pattern/deviations: Step-to pattern;Antalgic;Trunk flexed Gait velocity: decreased   General Gait Details: Pt needs verbal cues to breathe, RW safety/proximity and reinforeced WB status (WBAT) an no motion restrictions.       Balance Overall balance assessment: Needs assistance Sitting-balance support: Feet supported;No upper extremity supported Sitting balance-Leahy Scale: Fair     Standing balance support: Bilateral upper extremity supported Standing balance-Leahy Scale: Poor                               Pertinent Vitals/Pain Pain Assessment: 0-10 Pain Score: 5  Pain Location: right hip Pain Descriptors / Indicators: Aching;Burning Pain Intervention(s): Limited activity within patient's tolerance;Monitored during session;Repositioned    Home Living Family/patient expects to be discharged to:: Private residence Living Arrangements: Alone Available Help at Discharge: Other (Comment) (no help, really at home at discharge. 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       Hand Dominance   Dominant Hand: Right    Extremity/Trunk Assessment   Upper Extremity Assessment: Defer to OT evaluation           Lower Extremity Assessment: RLE deficits/detail RLE Deficits / Details: right leg  with normal post op pain and weakness.  Pt with at least 3/5 ankle, 3-/5 knee, 2/5 hip per gross functional assessment.     Cervical / Trunk Assessment: Normal  Communication   Communication: No difficulties  Cognition Arousal/Alertness: Lethargic;Suspect due to medications Behavior During Therapy: [facility] for tasks assessed/performed Overall Cognitive Status: Within Functional Limits for tasks assessed                          Exercises Total Joint Exercises Ankle Circles/Pumps: AROM;Both;20 reps Quad Sets: AROM;Right;10 reps Heel Slides: AAROM;Right;10 reps Hip ABduction/ADduction: AAROM;Right;10 reps      Assessment/Plan    PT Assessment Patient needs continued PT services  PT Diagnosis Abnormality of gait;Difficulty walking;Generalized weakness;Acute pain   PT Problem List Decreased strength;Decreased range of motion;Decreased activity tolerance;Decreased balance;Decreased mobility;Decreased knowledge of use of DME;Pain  PT Treatment Interventions DME instruction;Gait training;Functional mobility training;Therapeutic activities;Therapeutic exercise;Balance training;Neuromuscular re-education;Patient/family education;Manual techniques;Modalities   PT Goals (Current goals can be found in the Care Plan section) Acute Rehab PT Goals Patient Stated Goal: to be as independent as possible before going home PT Goal Formulation: With patient Time For Goal Achievement: 04/17/15 Potential to Achieve Goals: Good    Frequency 7X/week   Barriers to discharge Decreased caregiver support pt has no help at discharge       End of Session   Activity Tolerance: Patient limited by pain Patient left: in chair;with call bell/phone within reach Nurse Communication: Mobility status;Other (comment) (lightheaded and nauseated at end of session, vitals stable)         Time: 1610-9604 PT Time Calculation (min) (ACUTE ONLY): 42 min   Charges:   PT Evaluation $Initial PT Evaluation Tier I: 1 Procedure PT Treatments $Gait Training: 8-22 mins $Therapeutic Exercise: 8-22 mins        Klarissa Mcilvain B. Octavian Godek, PT, DPT [phone]   04/10/2015, 12:24 PM

## 2015-04-10 NOTE — [place] FL2 (Signed)
[place] MEDICAID FL2 LEVEL OF CARE SCREENING TOOL     IDENTIFICATION  Patient Name: Joseph Bruce Birthdate: 1956-03-27 Sex: male Admission Date (Current Location): 04/09/2015  [facility] [place]  [employer]   Facility and Address:  [facility], [address], [place], [place] [address]      Provider Number: [id number]  Attending Physician Name and Address:  Marcene CorningPeter Dalldorf, MD  Relative Name and Phone Number:       Current Level of Care: Hospital Recommended Level of Care: Skilled Nursing Facility Prior Approval Number:    Date Approved/Denied:   PASRR Number: [id number]  Discharge Plan: SNF    Current Diagnoses: Patient Active Problem List   Diagnosis Date Noted  . Primary osteoarthritis of right hip 04/09/2015  . Obesity 04/09/2015    Orientation RESPIRATION BLADDER Height & Weight    Self, Time, Situation, Place  Normal Continent 6\' 4"  (193 cm) 334 lbs.  BEHAVIORAL SYMPTOMS/MOOD NEUROLOGICAL BOWEL NUTRITION STATUS   (n/a)  (n/a) Continent Diet (Please see discharge summary.)  AMBULATORY STATUS COMMUNICATION OF NEEDS Skin   Limited Assist Verbally Surgical wounds                       Personal Care Assistance Level of Assistance  Bathing, Feeding, Dressing Bathing Assistance: Limited assistance Feeding assistance: Independent Dressing Assistance: Limited assistance     Functional Limitations Info   (n/a)          SPECIAL CARE FACTORS FREQUENCY  PT (By licensed PT), OT (By licensed OT)     PT Frequency: 5 OT Frequency: 5            Contractures      Additional Factors Info  Code Status, Allergies Code Status Info: FULL Allergies Info: No known allergies.           Current Medications (04/10/2015):  This is the current hospital active medication list Current Facility-Administered Medications  Medication Dose Route Frequency Provider Last Rate Last Dose  . acetaminophen (TYLENOL) tablet  650 mg  650 mg Oral Q6H PRN Elodia FlorenceAndrew Nida, PA-C       Or  . acetaminophen (TYLENOL) suppository 650 mg  650 mg Rectal Q6H PRN Elodia FlorenceAndrew Nida, PA-C      . alum & mag hydroxide-simeth (MAALOX/MYLANTA) 200-200-20 MG/5ML suspension 30 mL  30 mL Oral Q4H PRN Elodia FlorenceAndrew Nida, PA-C      . aspirin EC tablet 325 mg  325 mg Oral BID PC Elodia FlorenceAndrew Nida, PA-C   325 mg at 04/10/15 [id number]  . bisacodyl (DULCOLAX) EC tablet 5 mg  5 mg Oral Daily PRN Elodia FlorenceAndrew Nida, PA-C      . diphenhydrAMINE (BENADRYL) 12.5 MG/5ML elixir 12.5-25 mg  12.5-25 mg Oral Q4H PRN Elodia FlorenceAndrew Nida, PA-C      . docusate sodium (COLACE) capsule 100 mg  100 mg Oral BID Elodia FlorenceAndrew Nida, PA-C   100 mg at 04/10/15 [id number]  . HYDROcodone-acetaminophen (NORCO/VICODIN) 5-325 MG per tablet 1-2 tablet  1-2 tablet Oral Q4H PRN Elodia FlorenceAndrew Nida, PA-C   2 tablet at 04/10/15 1304  . HYDROmorphone (DILAUDID) injection 0.5-1 mg  0.5-1 mg Intravenous Q3H PRN Elodia FlorenceAndrew Nida, PA-C      . lactated ringers infusion   Intravenous Continuous Elodia FlorenceAndrew Nida, PA-C      . lisinopril (PRINIVIL,ZESTRIL) tablet 10 mg  10 mg Oral Daily Elodia FlorenceAndrew Nida, PA-C   10 mg at 04/10/15 [id number]  . menthol-cetylpyridinium (CEPACOL) lozenge 3 mg  1 lozenge Oral PRN Elodia Florence, PA-C       Or  . phenol (CHLORASEPTIC) mouth spray 1 spray  1 spray Mouth/Throat PRN Elodia Florence, PA-C      . methocarbamol (ROBAXIN) tablet 500 mg  500 mg Oral Q6H PRN Elodia Florence, PA-C   500 mg at 04/10/15 1304   Or  . methocarbamol (ROBAXIN) 500 mg in dextrose 5 % 50 mL IVPB  500 mg Intravenous Q6H PRN Elodia Florence, PA-C      . metoCLOPramide (REGLAN) tablet 5-10 mg  5-10 mg Oral Q8H PRN Elodia Florence, PA-C       Or  . metoCLOPramide (REGLAN) injection 5-10 mg  5-10 mg Intravenous Q8H PRN Elodia Florence, PA-C      . ondansetron [facility]) tablet 4 mg  4 mg Oral Q6H PRN Elodia Florence, PA-C       Or  . ondansetron [facility]) injection 4 mg  4 mg Intravenous Q6H PRN Elodia Florence, PA-C      . pantoprazole (PROTONIX) EC tablet 20 mg  20 mg Oral QAC breakfast  Elodia Florence, PA-C   20 mg at 04/10/15 0913  . simvastatin (ZOCOR) tablet 20 mg  20 mg Oral QHS Elodia Florence, PA-C   20 mg at 04/09/15 2158     Discharge Medications: Please see discharge summary for a list of discharge medications.  Relevant Imaging Results:  Relevant Lab Results:   Additional Information Social Security #: [id number]  Rojelio Brenner [phone]

## 2015-04-10 NOTE — Evaluation (Signed)
Occupational Therapy Evaluation Patient Details Name: Joseph Bruce MRN: [id number] DOB: 1955/08/10 Today's Date: 04/10/2015    History of Present Illness 59 y.o. male admitted to [facility] on 04/09/15 for elective R direct anterior THA.  Pt with significant PMHx of HTN.     Clinical Impression   Patient presenting with decreased ADL and functional mobility independence secondary to above. Patient independent to mod I PTA. Patient currently functioning at an overall min to max assist level. Patient will benefit from acute OT to increase overall independence in the areas of ADLs, functional mobility, and overall safety in order to safely discharge to venue listed below.     Follow Up Recommendations  SNF;Supervision/Assistance - 24 hour    Equipment Recommendations  Other (comment) (TBD next venue of care)    Recommendations for Other Services  None at this time     Precautions / Restrictions Precautions Precautions: Fall Restrictions Weight Bearing Restrictions: Yes RLE Weight Bearing: Weight bearing as tolerated      Mobility Bed Mobility Overal bed mobility: Needs Assistance Bed Mobility: Sit to Supine     Supine to sit: Mod assist     General bed mobility comments: Assistance for management of BLEs and cues for sequencing   Transfers Overall transfer level: Needs assistance Equipment used: Rolling walker (2 wheeled) Transfers: Sit to/from Stand Sit to Stand: Min assist         General transfer comment: min A for powering up into standing from recliner chair; vc for hand placement and technique    Balance Overall balance assessment: Needs assistance Sitting-balance support: No upper extremity supported;Feet supported Sitting balance-Leahy Scale: Fair     Standing balance support: Bilateral upper extremity supported;During functional activity Standing balance-Leahy Scale: Poor    ADL Overall ADL's : Needs assistance/impaired Eating/Feeding: Set  up;Sitting   Grooming: Set up;Sitting   Upper Body Bathing: Set up;Sitting   Lower Body Bathing: Moderate assistance;Sit to/from stand   Upper Body Dressing : Set up;Sitting   Lower Body Dressing: Maximal assistance;Sit to/from stand   Toilet Transfer: Minimal assistance;RW;BSC;Ambulation Functional mobility during ADLs: Minimal assistance;Cueing for safety;Rolling walker;Cueing for sequencing      Pertinent Vitals/Pain Pain Assessment: 0-10 Pain Score: 8  Pain Location: right hip Pain Descriptors / Indicators: Aching;Grimacing;Guarding Pain Intervention(s): Limited activity within patient's tolerance;Monitored during session;Repositioned;Ice applied     Hand Dominance Right   Extremity/Trunk Assessment Upper Extremity Assessment Upper Extremity Assessment: Overall WFL for tasks assessed;Generalized weakness   Lower Extremity Assessment Lower Extremity Assessment: Defer to PT evaluation   Cervical / Trunk Assessment Cervical / Trunk Assessment: Normal   Communication Communication Communication: No difficulties   Cognition Arousal/Alertness: Lethargic;Suspect due to medications Behavior During Therapy: [facility] for tasks assessed/performed Overall Cognitive Status: Within Functional Limits for tasks assessed              Home Living Family/patient expects to be discharged to:: Private residence Living Arrangements: Alone Available Help at Discharge: Other (Comment) (no help) Type of Home: House Home Access: Ramped entrance     Home Layout: One level     Bathroom Shower/Tub: Tub/shower unit;Curtain   Bathroom Toilet: Standard (one regular, one 2" higher)     Home Equipment: None   Prior Functioning/Environment Level of Independence: Independent        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      OT Diagnosis: Generalized weakness;Acute pain   OT Problem List: Decreased strength;Decreased range of motion;Decreased activity tolerance;Impaired balance (sitting  and/or standing);Decreased  safety awareness;Decreased knowledge of use of DME or AE;Pain;Obesity   OT Treatment/Interventions: Self-care/ADL training;Therapeutic exercise;Energy conservation;DME and/or AE instruction;Therapeutic activities;Patient/family education;Balance training    OT Goals(Current goals can be found in the care plan section) Acute Rehab OT Goals Patient Stated Goal: go to rehab OT Goal Formulation: With patient Time For Goal Achievement: 04/24/15 Potential to Achieve Goals: Good ADL Goals Pt Will Perform Grooming: with modified independence;standing Pt Will Perform Lower Body Bathing: with supervision;sit to/from stand;with adaptive equipment (AE prn) Pt Will Perform Lower Body Dressing: with supervision;with adaptive equipment;sit to/from stand (AE prn) Pt Will Transfer to Toilet: with modified independence;ambulating;bedside commode Pt Will Perform Tub/Shower Transfer: with supervision;ambulating;Tub transfer;rolling walker;3 in 1 Additional ADL Goal #1: Pt will be mod I with functional mobility using LRAD  OT Frequency: Min 2X/week   Barriers to D/C: Decreased caregiver support   End of Session Equipment Utilized During Treatment: Gait belt;Rolling walker  Activity Tolerance: Patient tolerated treatment well Patient left: in bed;with call bell/phone within reach   Time: [id number] OT Time Calculation (min): 24 min Charges:  OT General Charges $OT Visit: 1 Procedure OT Evaluation $Initial OT Evaluation Tier I: 1 Procedure OT Treatments $Self Care/Home Management : 8-22 mins  Edwin CapPatricia Jaliah Foody , MS, OTR/L, CLT Pager: [phone]  04/10/2015, 3:41 PM

## 2015-04-10 NOTE — Care Management Note (Signed)
Case Management Note  Patient Details  Name: Lorn Juneshomas L Sebo MRN: [id number] Date of Birth: [birth year]  Subjective/Objective:          S/p right total hip arthroplasty           Action/Plan: PT recommending SNF. Referral made to CSW. Will continue to follow.   Expected Discharge Date:  04/12/15               Expected Discharge Plan:  Skilled Nursing Facility  In-House Referral:  Clinical Social Work  Discharge planning Services  CM Consult  Post Acute Care Choice:    Choice offered to:     DME Arranged:    DME Agency:     HH Arranged:    HH Agency:     Status of Service:  In process, will continue to follow  Medicare Important Message Given:    Date Medicare IM Given:    Medicare IM give by:    Date Additional Medicare IM Given:    Additional Medicare Important Message give by:     If discussed at Long Length of Stay Meetings, dates discussed:    Additional Comments:  Monica BectonKrieg, Macklyn Glandon Watson, RN 04/10/2015, 2:10 PM

## 2015-04-10 NOTE — Progress Notes (Signed)
Utilization review completed.  

## 2015-04-10 NOTE — Progress Notes (Signed)
Physical Therapy Treatment Patient Details Name: Joseph Bruce MRN: [id number] DOB: [birth year] Today's Date: 04/10/2015    History of Present Illness 59 y.o. male [facility] for elective R direct anterior THA.  Pt with significant PMHx of HTN.      PT Comments    Patient with ability to ambulate 65 ft. Pt led through exercises. Overall mobility level of min A/ min guard for safety. Pt lethargic at times and had tendency to close eyes during task performance. Continue to progress as tolerated.  Follow Up Recommendations  SNF     Equipment Recommendations  Rolling walker with 5" wheels;3in1 (PT)    Recommendations for Other Services       Precautions / Restrictions Precautions Precautions: None Restrictions Weight Bearing Restrictions: Yes RLE Weight Bearing: Weight bearing as tolerated    Mobility  Bed Mobility               General bed mobility comments: OOB in chair upon arrival  Transfers Overall transfer level: Needs assistance Equipment used: Rolling walker (2 wheeled) Transfers: Sit to/from Stand Sit to Stand: Min assist         General transfer comment: min A for powering up into standing from recliner chair; vc for hand placement and technique  Ambulation/Gait Ambulation/Gait assistance: Min guard Ambulation Distance (Feet): 65 Feet Assistive device: Rolling walker (2 wheeled) Gait Pattern/deviations: Step-through pattern;Decreased stride length;Decreased stance time - right;Decreased step length - left;Antalgic;Trunk flexed Gait velocity: decreased   General Gait Details: vc for upright posture and encouraged to increase WS to R LE   Stairs            Wheelchair Mobility    Modified Rankin (Stroke Patients Only)       Balance Overall balance assessment: Needs assistance Sitting-balance support: Feet supported Sitting balance-Leahy Scale: Fair     Standing balance support: Bilateral upper extremity  supported Standing balance-Leahy Scale: Poor                      Cognition Arousal/Alertness: Lethargic;Suspect due to medications Behavior [facility] for tasks assessed/performed Overall Cognitive Status: Within Functional Limits for tasks assessed                      Exercises Total Joint Exercises Ankle Circles/Pumps: AROM;Both;20 reps Quad Sets: AROM;Right;10 reps Heel Slides: AAROM;Right;10 reps Knee Flexion: AROM;Right;10 reps;Standing Marching in Standing: AROM;Right;10 reps;Standing Standing Hip Extension: AROM;Right;10 reps;Seated    General Comments        Pertinent Vitals/Pain Pain Assessment: 0-10 Pain Score: 5  Pain Location: R hip Pain Descriptors / Indicators: Aching Pain Intervention(s): Limited activity within patient's tolerance;Monitored during session;Premedicated before session    Home Living                      Prior Function            PT Goals (current goals can now be found in the care plan section) Acute Rehab PT Goals Patient Stated Goal: to be as independent as possible before going home PT Goal Formulation: With patient Time For Goal Achievement: 04/17/15 Potential to Achieve Goals: Good Progress towards PT goals: Progressing toward goals    Frequency  7X/week    PT Plan Current plan remains appropriate    Co-evaluation             End of Session Equipment Utilized During Treatment: Gait belt Activity  Tolerance: Patient limited by pain Patient left: in chair;with call bell/phone within reach     Time: 1610-9604 PT Time Calculation (min) (ACUTE ONLY): 25 min  Charges:  $Gait Training: 8-22 mins $Therapeutic Exercise: 8-22 mins                    G Codes:      Derek Mound, PTA Pager: [phone]   04/10/2015, 3:12 PM

## 2015-04-11 LAB — CBC
HEMATOCRIT: 27.3 % — AB (ref 39.0–52.0)
Hemoglobin: 9.1 g/dL — ABNORMAL LOW (ref 13.0–17.0)
MCH: 29.4 pg (ref 26.0–34.0)
MCHC: 33.3 g/dL (ref 30.0–36.0)
MCV: 88.3 fL (ref 78.0–100.0)
PLATELETS: 153 10*3/uL (ref 150–400)
RBC: 3.09 MIL/uL — AB (ref 4.22–5.81)
RDW: 13.6 % (ref 11.5–15.5)
WBC: 10.7 10*3/uL — ABNORMAL HIGH (ref 4.0–10.5)

## 2015-04-11 NOTE — Progress Notes (Signed)
Subjective: 2 Days Post-Op Procedure(s) (LRB): TOTAL HIP ARTHROPLASTY ANTERIOR APPROACH (Right)   Patient just noticed about 20 minutes ago that he is having some penile and scrotal swelling. It is not painful and he is still able to pass urine. He denies having anything like this ever happen before. He states that he is not circumcised.   Activity level:  wbat Diet tolerance:  ok Voiding:  ok Patient reports pain as mild and moderate.    Objective: Vital signs in last 24 hours: Temp:  [98.6 F (37 C)-99.5 F (37.5 C)] 99.5 F (37.5 C) (12/22 1403) Pulse Rate:  [81-101] 101 (12/22 1403) Resp:  [14-18] 17 (12/22 1403) BP: (98-113)/(50-67) 113/67 mmHg (12/22 1403) SpO2:  [91 %-97 %] 97 % (12/22 1403)  Labs:  Recent Labs  04/09/15 1422 04/10/15 0529 04/11/15 0614  HGB 12.6* 10.6* 9.1*    Recent Labs  04/10/15 0529 04/11/15 0614  WBC 9.5 10.7*  RBC 3.59* 3.09*  HCT 32.2* 27.3*  PLT 176 153    Recent Labs  04/09/15 1422 04/10/15 0529  NA 140 136  K 4.9 4.5  CL  --  103  CO2  --  23  BUN  --  19  CREATININE  --  1.30*  GLUCOSE  --  177*  CALCIUM  --  8.5*   No results for input(s): LABPT, INR in the last 72 hours.  Physical Exam:  Neurologically intact ABD soft Neurovascular intact Sensation intact distally Intact pulses distally Dorsiflexion/Plantar flexion intact Incision: dressing C/D/I and no drainage No cellulitis present Compartment soft ther is noticible penile swelling. There appears to be a mild amount of scrotal swelling also. he is able to pass urine. It is non tender to palpation. No signs of infection.   Assessment/Plan:  2 Days Post-Op Procedure(s) (LRB): TOTAL HIP ARTHROPLASTY ANTERIOR APPROACH (Right) Advance diet Up with therapy Plan for discharge tomorrow [place], [facility] [place], if doing well and cleared by PT.  I have spoken with Urology and they will come by to see the patient for his penile and scrotal swelling.   He will contionue on ASA 325mg  BID x 4 weeks for DVT prevention. Follow up in office 2 weeks post op.  Dreana Britz, Ginger OrganNDREW PAUL 04/11/2015, 3:23 PM

## 2015-04-11 NOTE — Consult Note (Signed)
Urology Consult  Referring physician: Dr. Rhona Raider Reason for referral: penile and scrotal swelling  Chief Complaint: penile swelling  History of Present Illness: Joseph Bruce is a 59yo osteoarthritis s/p R hip arthroplasty who has a 1 day hx of penile and scrotal swelling. This is the first time he has had this issue. This morning the patient noted penile and foreskin swelling. He cannot retract his foresking due to the swelling He denies any pain. He also noticed scrotal swelling but denies nay scrotal or testicular pain. Pt denies any LUTS, hematuria. He feels he can empty his bladder completely.   Past Medical History  Diagnosis Date  . Hypertension   . Serum lipids high   . Sleep apnea   . Arthritis   . GERD (gastroesophageal reflux disease)     ulcer   Past Surgical History  Procedure Laterality Date  . Colonoscopy    . Wisdom tooth extraction    . Total hip arthroplasty Right 04/09/2015    Procedure: TOTAL HIP ARTHROPLASTY ANTERIOR APPROACH;  Surgeon: Melrose Nakayama, MD;  Location: [facility];  Service: Orthopedics;  Laterality: Right;    Medications: I have reviewed the patient's current medications. Allergies: No Known Allergies  History reviewed. No pertinent family history. Social History:  reports that he has never smoked. He does not have any smokeless tobacco history on file. He reports that he drinks alcohol. He reports that he does not use illicit drugs.  Review of Systems  Musculoskeletal: Positive for joint pain.  All other systems reviewed and are negative.   Physical Exam:  Vital signs in last 24 hours: Temp:  [98.2 F (36.8 C)-100.1 F (37.8 C)] 98.2 F (36.8 C) (12/22 2014) Pulse Rate:  [81-106] 106 (12/22 2014) Resp:  [14-18] 17 (12/22 2014) BP: (98-116)/(50-67) 116/59 mmHg (12/22 2014) SpO2:  [91 %-97 %] 96 % (12/22 2014) Physical Exam  Constitutional: He is oriented to person, place, and time. He appears well-developed and well-nourished.  HENT:   Head: Normocephalic and atraumatic.  Eyes: EOM are normal. Pupils are equal, round, and reactive to light.  Neck: Normal range of motion. No thyromegaly present.  Cardiovascular: Normal rate and regular rhythm.   Respiratory: Effort normal. No respiratory distress.  GI: Soft. He exhibits no distension. A hernia is present. Hernia confirmed positive in the right inguinal area. Hernia confirmed negative in the left inguinal area.  Genitourinary: Testes normal. Right testis shows no mass. Left testis shows no mass. Uncircumcised.  Moderate penile edema. Mild scrotal edema. No paraphimosis   Musculoskeletal:       Right hip: He exhibits tenderness.  Neurological: He is alert and oriented to person, place, and time.  Skin: Skin is warm and dry.  Psychiatric: He has a normal mood and affect. His behavior is normal. Judgment and thought content normal.    Laboratory Data:  Results for orders placed or performed during the hospital encounter of 04/09/15 (from the past 72 hour(s))  POCT I-Stat EG7     Status: Abnormal   Collection Time: 04/09/15  2:22 PM  Result Value Ref Range   pH, Ven 7.404 (H) 7.250 - 7.300   pCO2, Ven 41.4 (L) 45.0 - 50.0 mmHg   pO2, Ven 41.0 30.0 - 45.0 mmHg   Bicarbonate 26.1 (H) 20.0 - 24.0 mEq/L   TCO2 27 0 - 100 mmol/L   O2 Saturation 79.0 %   Acid-Base Excess 1.0 0.0 - 2.0 mmol/L   Sodium 140 135 - 145 mmol/L  Potassium 4.9 3.5 - 5.1 mmol/L   Calcium, Ion 1.21 1.12 - 1.23 mmol/L   HCT 37.0 (L) 39.0 - 52.0 %   Hemoglobin 12.6 (L) 13.0 - 17.0 g/dL   Patient temperature 36.1 C    Sample type VENOUS   CBC     Status: Abnormal   Collection Time: 04/10/15  5:29 AM  Result Value Ref Range   WBC 9.5 4.0 - 10.5 K/uL   RBC 3.59 (L) 4.22 - 5.81 MIL/uL   Hemoglobin 10.6 (L) 13.0 - 17.0 g/dL   HCT 32.2 (L) 39.0 - 52.0 %   MCV 89.7 78.0 - 100.0 fL   MCH 29.5 26.0 - 34.0 pg   MCHC 32.9 30.0 - 36.0 g/dL   RDW 13.8 11.5 - 15.5 %   Platelets 176 150 - 400 K/uL  Basic  metabolic panel     Status: Abnormal   Collection Time: 04/10/15  5:29 AM  Result Value Ref Range   Sodium 136 135 - 145 mmol/L   Potassium 4.5 3.5 - 5.1 mmol/L   Chloride 103 101 - 111 mmol/L   CO2 23 22 - 32 mmol/L   Glucose, Bld 177 (H) 65 - 99 mg/dL   BUN 19 6 - 20 mg/dL   Creatinine, Ser 1.30 (H) 0.61 - 1.24 mg/dL   Calcium 8.5 (L) 8.9 - 10.3 mg/dL   GFR calc non Af Amer 59 (L) >60 mL/min   GFR calc Af Amer >60 >60 mL/min    Comment: (NOTE) The eGFR has been calculated using the CKD EPI equation. This calculation has not been validated in all clinical situations. eGFR's persistently <60 mL/min signify possible Chronic Kidney Disease.    Anion gap 10 5 - 15  CBC     Status: Abnormal   Collection Time: 04/11/15  6:14 AM  Result Value Ref Range   WBC 10.7 (H) 4.0 - 10.5 K/uL   RBC 3.09 (L) 4.22 - 5.81 MIL/uL   Hemoglobin 9.1 (L) 13.0 - 17.0 g/dL   HCT 27.3 (L) 39.0 - 52.0 %   MCV 88.3 78.0 - 100.0 fL   MCH 29.4 26.0 - 34.0 pg   MCHC 33.3 30.0 - 36.0 g/dL   RDW 13.6 11.5 - 15.5 %   Platelets 153 150 - 400 K/uL   No results found for this or any previous visit (from the past 240 hour(s)). Creatinine:  Recent Labs  04/10/15 0529  CREATININE 1.30*   Baseline Creatinine: unknown  Impression/Assessment:  59yo with penile and scrotal edema  Plan:  1. I discussed the causes of penile and scrotal edema to the patient and counseled him that this likely represents simple edema versus hematoma. I counseled him that his penis and scrotum may become ecchymotic over the next couple of days but it will resolve over the next couple of weeks.  2. Please place scrotal support to assist with resolution of the scrotal edema.  3. Pt is currently urinating well. If he develops urinary retention the patient will need a indwelling foley until the penile edema resolves. 4. The patient should followup with Urology in 1 month to ensure resolution of edema  Joseph Bruce L 04/11/2015,  9:27 PM

## 2015-04-11 NOTE — Progress Notes (Signed)
Physical Therapy Treatment Note    04/11/15 1500  PT Visit Information  Last PT Received On 04/11/15  Assistance Needed +1  History of Present Illness 59 y.o. male [facility] for elective R direct anterior THA.  Pt with significant PMHx of HTN.    PT Time Calculation  PT Start Time (ACUTE ONLY) 1452  PT Stop Time (ACUTE ONLY) 1508  PT Time Calculation (min) (ACUTE ONLY) 16 min  Subjective Data  Subjective Pt assisted with ambulating in hallway and reports more pain this afternoon.  Plan is now for SNF likely tomorrow.  Precautions  Precautions Fall  Restrictions  RLE Weight Bearing WBAT  Pain Assessment  Pain Assessment 0-10  Pain Score 7  Pain Location right hip with movement  Pain Descriptors / Indicators Aching;Sore;Tightness  Pain Intervention(s) Limited activity within patient's tolerance;Monitored during session;Repositioned;Patient requesting pain meds-RN notified  Cognition  Arousal/Alertness Awake/alert  Behavior During Therapy WFL for tasks assessed/performed  Overall Cognitive Status Within Functional Limits for tasks assessed  Bed Mobility  Overal bed mobility Needs Assistance  Bed Mobility Sit to Supine  Sit to supine Min assist  General bed mobility comments assist for R LE  Transfers  Overall transfer level Needs assistance  Equipment used Rolling walker (2 wheeled)  Transfers Sit to/from Stand  Sit to Stand Min assist  General transfer comment assist to rise and steady as well as control descent  Ambulation/Gait  Ambulation/Gait assistance Min guard  Ambulation Distance (Feet) 200 Feet  Assistive device Rolling walker (2 wheeled)  Gait Pattern/deviations Step-to pattern;Antalgic;Trunk flexed;Decreased stance time - right  General Gait Details verbal cues for step length, posture, RW positioning; required a few standing rest breaks to "stretch" c/o increased pain and tightness R hip  Gait velocity decreased  PT - End of Session  Activity  Tolerance Patient limited by pain  Patient left in bed;with call bell/phone within reach  Nurse Communication Mobility status;Patient requests pain meds  PT - Assessment/Plan  PT Plan Current plan remains appropriate  PT Frequency (ACUTE ONLY) 7X/week  Follow Up Recommendations SNF  PT equipment Rolling walker with 5" wheels;3in1 (PT)  PT Goal Progression  Progress towards PT goals Progressing toward goals  PT General Charges  $$ ACUTE PT VISIT 1 Procedure  PT Treatments  $Gait Training 8-22 mins   Zenovia JarredKati Filiberto Wamble, PT, DPT 04/11/2015 Pager: [phone]

## 2015-04-11 NOTE — Progress Notes (Signed)
RT note: PT. placed on CPAP @ 14 cmh20 for h/s at current settings on room air with L FFM, tolerating well, RT to monitor.

## 2015-04-11 NOTE — Progress Notes (Signed)
Physical Therapy Treatment Patient Details Name: Joseph Bruce MRN: [id number] DOB: [birth year] Today's Date: 04/11/2015    History of Present Illness 59 y.o. male [facility] for elective R direct anterior THA.  Pt with significant PMHx of HTN.      PT Comments    Pt tolerated improvement in ambulation distance and performed LE exercises.  Follow Up Recommendations  SNF     Equipment Recommendations  Rolling walker with 5" wheels;3in1 (PT)    Recommendations for Other Services       Precautions / Restrictions Precautions Precautions: Fall Restrictions Weight Bearing Restrictions: No RLE Weight Bearing: Weight bearing as tolerated    Mobility  Bed Mobility Overal bed mobility: Needs Assistance Bed Mobility: Supine to Sit     Supine to sit: Supervision     General bed mobility comments: verbal cues for self assist  Transfers Overall transfer level: Needs assistance Equipment used: Rolling walker (2 wheeled) Transfers: Sit to/from Stand Sit to Stand: Min assist         General transfer comment: assist to rise and steady, verbal cues for UE and LE positioning  Ambulation/Gait Ambulation/Gait assistance: Min guard Ambulation Distance (Feet): 200 Feet Assistive device: Rolling walker (2 wheeled) Gait Pattern/deviations: Step-through pattern;Decreased stride length;Decreased stance time - right;Decreased step length - left;Antalgic     General Gait Details: verbal cues for step length, posture, RW positioning   Stairs            Wheelchair Mobility    Modified Rankin (Stroke Patients Only)       Balance                                    Cognition Arousal/Alertness: Awake/alert Behavior During Therapy: WFL for tasks assessed/performed Overall Cognitive Status: Within Functional Limits for tasks assessed                      Exercises Total Joint Exercises Ankle Circles/Pumps: AROM;Both;10  reps Quad Sets: AROM;Right;10 reps Gluteal Sets: AROM;Both;10 reps Towel Squeeze: AROM;Both;10 reps Short Arc Quad: AROM;Right;10 reps Heel Slides: AAROM;Right;10 reps Hip ABduction/ADduction: AAROM;Right;10 reps    General Comments        Pertinent Vitals/Pain Pain Assessment: 0-10 Pain Score: 7  Pain Location: right hip with gait Pain Descriptors / Indicators: Aching;Sore Pain Intervention(s): Limited activity within patient's tolerance;Monitored during session;Repositioned    Home Living                      Prior Function            PT Goals (current goals can now be found in the care plan section) Progress towards PT goals: Progressing toward goals    Frequency  7X/week    PT Plan Current plan remains appropriate    Co-evaluation             End of Session Equipment Utilized During Treatment: Gait belt Activity Tolerance: Patient tolerated treatment well Patient left: in chair;with call bell/phone within reach     Time: 0905-0930 PT Time Calculation (min) (ACUTE ONLY): 25 min  Charges:  $Gait Training: 8-22 mins $Therapeutic Exercise: 8-22 mins                    G Codes:      Sukhman Martine,KATHrine E 04/11/2015, 12:28 PM Zenovia JarredKati Debby Clyne, PT, DPT 04/11/2015 Pager: [phone]

## 2015-04-11 NOTE — Clinical Social Work Note (Signed)
Clinical Social Work Assessment  Patient Details  Name: Joseph Bruce MRN: [id number] Date of Birth: 04-Mar-1956  Date of referral:  04/11/15               Reason for consult:  Facility Placement, Discharge Planning                Permission sought to share information with:  Chartered certified accountant granted to share information::  Yes, Verbal Permission Granted  Name::        Agency::  [facility] (Preference: [facility])  Relationship::     Contact Information:     Housing/Transportation Living arrangements for the past 2 months:  Parks of Information:  Patient Patient Interpreter Needed:  None Criminal Activity/Legal Involvement Pertinent to Current Situation/Hospitalization:  No - Comment as needed Significant Relationships:  Other Family Members (cousins) Lives with:  Self Do you feel safe going back to the place where you live?  No (High fall risk.) Need for family participation in patient care:  No (Coment) (Patient able to make own decisions.)  Care giving concerns:  Patient expressed no concerns at this time.   Social Worker assessment / plan:  CSW received referral for possible SNF placement at time of discharge. CSW met with patient at bedside to discuss discharge planning. Patient stated understanding of PT recommendation for SNF placement and expressed preference for [facility] at time of discharge. CSW to continue to follow and assist with discharge planning needs.  Employment status:  Other (Comment) (Did not disclose.) Insurance information:  Other (Comment Required) ([employer]) PT Recommendations:  [place] / Referral to community resources:  [place]  Patient/Family's Response to care:  Patient understanding and agreeable to CSW plan of care.  Patient/Family's Understanding of and Emotional Response to Diagnosis, Current Treatment, and Prognosis:   Patient understanding and agreeable to CSW plan of care.  Emotional Assessment Appearance:  Appears stated age Attitude/Demeanor/Rapport:  Other (Pleasant.) Affect (typically observed):  Accepting, Appropriate, Quiet, Pleasant, Flat Orientation:  Oriented to Self, Oriented to Place, Oriented to  Time, Oriented to Situation Alcohol / Substance use:  Not Applicable Psych involvement (Current and /or in the community):  No (Comment) (Not appropriate at this time.)  Discharge Needs  Concerns to be addressed:  No discharge needs identified Readmission within the last 30 days:  No Current discharge risk:  None Barriers to Discharge:  No Barriers Identified   Caroline Sauger, LCSW 04/11/2015, 11:16 AM [phone]

## 2015-04-11 NOTE — Clinical Social Work Placement (Signed)
   CLINICAL SOCIAL WORK PLACEMENT  NOTE  Date:  04/11/2015  Patient Details  Name: Joseph Bruce MRN: [id number] Date of Birth: 09-29-1955  Clinical Social Work is seeking post-discharge placement for this patient at the Skilled  Nursing Facility level of care (*CSW will initial, date and re-position this form in  chart as items are completed):  Yes   Patient/family provided with [facility] list of facilities offering this level of care within the geographic area requested by the patient (or if unable, by the patient'Bruce family).  Yes   Patient/family informed of their freedom to choose among providers that offer the needed level of care, that participate in Medicare, Medicaid or managed care program needed by the patient, have an available bed and are willing to accept the patient.  Yes   Patient/family informed of [place] ownership interest in [facility] and [facility], as well as of the fact that they are under no obligation to receive care at these facilities.  PASRR submitted to EDS on 04/11/15     PASRR number received on 04/11/15     Existing PASRR number confirmed on       FL2 transmitted to all facilities in geographic area requested by pt/family on 04/11/15     FL2 transmitted to all facilities within larger geographic area on       Patient informed that his/her managed care company has contracts with or will negotiate with certain facilities, including the following:            Patient/family informed of bed offers received.  Patient chooses bed at       Physician recommends and patient chooses bed at      Patient to be transferred to   on  .  Patient to be transferred to facility by       Patient family notified on   of transfer.  Name of family member notified:        PHYSICIAN Please sign FL2     Additional Comment:    _______________________________________________ Rod MaeVaughn, Joseph Axelson S, LCSW 04/11/2015,  11:21 AM

## 2015-04-12 LAB — CBC
HCT: 26.3 % — ABNORMAL LOW (ref 39.0–52.0)
Hemoglobin: 8.5 g/dL — ABNORMAL LOW (ref 13.0–17.0)
MCH: 28.7 pg (ref 26.0–34.0)
MCHC: 32.3 g/dL (ref 30.0–36.0)
MCV: 88.9 fL (ref 78.0–100.0)
PLATELETS: 181 10*3/uL (ref 150–400)
RBC: 2.96 MIL/uL — AB (ref 4.22–5.81)
RDW: 13.4 % (ref 11.5–15.5)
WBC: 9.2 10*3/uL (ref 4.0–10.5)

## 2015-04-12 MED ORDER — ASPIRIN 325 MG PO TBEC: 325.0000 mg | DELAYED_RELEASE_TABLET | Freq: Two times a day (BID) | ORAL | Status: DC

## 2015-04-12 MED ORDER — METHOCARBAMOL 500 MG PO TABS: 500.0000 mg | ORAL_TABLET | Freq: Four times a day (QID) | ORAL | Status: DC | PRN

## 2015-04-12 MED ORDER — HYDROCODONE-ACETAMINOPHEN 5-325 MG PO TABS: 1.0000 | ORAL_TABLET | ORAL | Status: DC | PRN

## 2015-04-12 NOTE — Discharge Planning (Signed)
Patient to be discharged to [facility]. Patient updated regarding discharge.  Facility: [facility]  (Room 701) RN report number: [phone] Transportation: [facility] (scheduled for 1pm)  Marcelline Deistmily Farha Dano, [place] [phone] Orthopedics: [phone] Surgical: [phone]

## 2015-04-12 NOTE — Discharge Summary (Signed)
Patient ID: Joseph Bruce MRN: [id number] DOB/AGE: 1955-08-11 59 y.o.  Admit date: 04/09/2015 Discharge date: 04/12/2015  Admission Diagnoses:  Principal Problem:   Primary osteoarthritis of right hip Active Problems:   Obesity   Discharge Diagnoses:  Same  Past Medical History  Diagnosis Date  . Hypertension   . Serum lipids high   . Sleep apnea   . Arthritis   . GERD (gastroesophageal reflux disease)     ulcer    Surgeries: Procedure(s): TOTAL HIP ARTHROPLASTY ANTERIOR APPROACH on 04/09/2015   Consultants:    Discharged Condition: Improved  Hospital Course: DAMARRION MIMBS is an 59 y.o. male who was admitted 04/09/2015 for operative treatment ofPrimary osteoarthritis of right hip. Patient has severe unremitting pain that affects sleep, daily activities, and work/hobbies. After pre-op clearance the patient was taken to the operating room on 04/09/2015 and underwent  Procedure(s): TOTAL HIP ARTHROPLASTY ANTERIOR APPROACH.    Patient was given perioperative antibiotics: Anti-infectives    Start     Dose/Rate Route Frequency Ordered Stop   04/09/15 1815  ceFAZolin (ANCEF) IVPB 2 g/50 mL premix     2 g 100 mL/hr over 30 Minutes Intravenous Every 6 hours 04/09/15 1800 04/10/15 0221   04/09/15 0930  ceFAZolin (ANCEF) 3 g in dextrose 5 % 50 mL IVPB     3 g 160 mL/hr over 30 Minutes Intravenous To ShortStay Surgical 04/08/15 1400 04/09/15 1102       Patient was given sequential compression devices, early ambulation, and chemoprophylaxis to prevent DVT.  Patient benefited maximally from hospital stay and there were no complications.    Recent vital signs: Patient Vitals for the past 24 hrs:  BP Temp Temp src Pulse Resp SpO2  04/12/15 0527 125/62 mmHg 98 F (36.7 C) Oral 98 17 94 %  04/11/15 2340 - - - (!) 105 20 96 %  04/11/15 2014 (!) 116/59 mmHg 98.2 F (36.8 C) Oral (!) 106 17 96 %  04/11/15 1700 - 100.1 F (37.8 C) Oral - - -  04/11/15 1403 113/67 mmHg  99.5 F (37.5 C) Oral (!) 101 17 97 %     Recent laboratory studies:  Recent Labs  04/09/15 1422  04/10/15 0529 04/11/15 0614 04/12/15 0549  WBC  --   < > 9.5 10.7* 9.2  HGB 12.6*  --  10.6* 9.1* 8.5*  HCT 37.0*  --  32.2* 27.3* 26.3*  PLT  --   < > 176 153 181  NA 140  --  136  --   --   K 4.9  --  4.5  --   --   CL  --   --  103  --   --   CO2  --   --  23  --   --   BUN  --   --  19  --   --   CREATININE  --   --  1.30*  --   --   GLUCOSE  --   --  177*  --   --   CALCIUM  --   --  8.5*  --   --   < > = values in this interval not displayed.   Discharge Medications:     Medication List    STOP taking these medications        ibuprofen 200 MG tablet  Commonly known as:  ADVIL,MOTRIN      TAKE these medications  aspirin 325 MG EC tablet  Take 1 tablet (325 mg total) by mouth 2 (two) times daily after a meal.     Fish Oil 1200 MG Caps  Take 2,400 mg by mouth daily.     HYDROcodone-acetaminophen 5-325 MG tablet  Commonly known as:  NORCO/VICODIN  Take 1-2 tablets by mouth every 4 (four) hours as needed (breakthrough pain).     lisinopril 10 MG tablet  Commonly known as:  PRINIVIL,ZESTRIL  Take 10 mg by mouth daily.     methocarbamol 500 MG tablet  Commonly known as:  ROBAXIN  Take 1 tablet (500 mg total) by mouth every 6 (six) hours as needed for muscle spasms.     multivitamin with minerals Tabs tablet  Take 1 tablet by mouth daily. Centrum Silver 50 plus     omeprazole 20 MG tablet  Commonly known as:  PRILOSEC OTC  Take 20 mg by mouth daily before breakfast.     simvastatin 20 MG tablet  Commonly known as:  ZOCOR  Take 20 mg by mouth at bedtime.        Diagnostic Studies: Dg Chest 2 View  03/29/2015  CLINICAL DATA:  Preop examination for right hip surgery EXAM: CHEST  2 VIEW COMPARISON:  None. FINDINGS: The heart size and mediastinal contours are within normal limits. Both lungs are clear. The visualized skeletal structures are  unremarkable. IMPRESSION: No active cardiopulmonary disease. Electronically Signed   By: Marlan Palau M.D.   On: 03/29/2015 16:18   Dg Hip Operative Unilat With Pelvis Right  04/09/2015  CLINICAL DATA:  Patient status post right hip replacement. EXAM: OPERATIVE RIGHT HIP (WITH PELVIS IF PERFORMED) 2 VIEWS TECHNIQUE: Fluoroscopic spot image(s) were submitted for interpretation post-operatively. COMPARISON:  If MRI 04/18/2013 FINDINGS: 2 intraoperative fluoroscopic images were submitted. Findings demonstrate patient to be status post right hip arthroplasty. Hardware appears intact. IMPRESSION: Patient status post right hip arthroplasty. Electronically Signed   By: Annia Belt M.D.   On: 04/09/2015 15:08    Disposition: Final discharge disposition not confirmed      Discharge Instructions    Call MD / Call 911    Complete by:  As directed   If you experience chest pain or shortness of breath, CALL 911 and be transported to the hospital emergency room.  If you develope a fever above 101 F, pus (white drainage) or increased drainage or redness at the wound, or calf pain, call your surgeon's office.     Constipation Prevention    Complete by:  As directed   Drink plenty of fluids.  Prune juice may be helpful.  You may use a stool softener, such as Colace (over the counter) 100 mg twice a day.  Use MiraLax (over the counter) for constipation as needed.     Diet - low sodium heart healthy    Complete by:  As directed      Discharge instructions    Complete by:  As directed   INSTRUCTIONS AFTER JOINT REPLACEMENT   Remove items at home which could result in a fall. This includes throw rugs or furniture in walking pathways ICE to the affected joint every three hours while awake for 30 minutes at a time, for at least the first 3-5 days, and then as needed for pain and swelling.  Continue to use ice for pain and swelling. You may notice swelling that will progress down to the foot and ankle.  This is  normal after surgery.  Elevate  your leg when you are not up walking on it.   Continue to use the breathing machine you got in the hospital (incentive spirometer) which will help keep your temperature down.  It is common for your temperature to cycle up and down following surgery, especially at night when you are not up moving around and exerting yourself.  The breathing machine keeps your lungs expanded and your temperature down.   DIET:  As you were doing prior to hospitalization, we recommend a well-balanced diet.  DRESSING / WOUND CARE / SHOWERING  You may shower 3 days after surgery, but keep the wounds dry during showering.  You may use an occlusive plastic wrap (Press'n Seal for example), NO SOAKING/SUBMERGING IN THE BATHTUB.  If the bandage gets wet, change with a clean dry gauze.  If the incision gets wet, pat the wound dry with a clean towel.  ACTIVITY  Increase activity slowly as tolerated, but follow the weight bearing instructions below.   No driving for 6 weeks or until further direction given by your physician.  You cannot drive while taking narcotics.  No lifting or carrying greater than 10 lbs. until further directed by your surgeon. Avoid periods of inactivity such as sitting longer than an hour when not asleep. This helps prevent blood clots.  You may return to work once you are authorized by your doctor.     WEIGHT BEARING   Weight bearing as tolerated with assist device (walker, cane, etc) as directed, use it as long as suggested by your surgeon or therapist, typically at least 4-6 weeks.   EXERCISES  Results after joint replacement surgery are often greatly improved when you follow the exercise, range of motion and muscle strengthening exercises prescribed by your doctor. Safety measures are also important to protect the joint from further injury. Any time any of these exercises cause you to have increased pain or swelling, decrease what you are doing until you are  comfortable again and then slowly increase them. If you have problems or questions, call your caregiver or physical therapist for advice.   Rehabilitation is important following a joint replacement. After just a few days of immobilization, the muscles of the leg can become weakened and shrink (atrophy).  These exercises are designed to build up the tone and strength of the thigh and leg muscles and to improve motion. Often times heat used for twenty to thirty minutes before working out will loosen up your tissues and help with improving the range of motion but do not use heat for the first two weeks following surgery (sometimes heat can increase post-operative swelling).   These exercises can be done on a training (exercise) mat, on the floor, on a table or on a bed. Use whatever works the best and is most comfortable for you.    Use music or television while you are exercising so that the exercises are a pleasant break in your day. This will make your life better with the exercises acting as a break in your routine that you can look forward to.   Perform all exercises about fifteen times, three times per day or as directed.  You should exercise both the operative leg and the other leg as well.   Exercises include:   Quad Sets - Tighten up the muscle on the front of the thigh (Quad) and hold for 5-10 seconds.   Straight Leg Raises - With your knee straight (if you were given a brace, keep it on), lift  the leg to 60 degrees, hold for 3 seconds, and slowly lower the leg.  Perform this exercise against resistance later as your leg gets stronger.  Leg Slides: Lying on your back, slowly slide your foot toward your buttocks, bending your knee up off the floor (only go as far as is comfortable). Then slowly slide your foot back down until your leg is flat on the floor again.  Angel Wings: Lying on your back spread your legs to the side as far apart as you can without causing discomfort.  Hamstring Strength:   Lying on your back, push your heel against the floor with your leg straight by tightening up the muscles of your buttocks.  Repeat, but this time bend your knee to a comfortable angle, and push your heel against the floor.  You may put a pillow under the heel to make it more comfortable if necessary.   A rehabilitation program following joint replacement surgery can speed recovery and prevent re-injury in the future due to weakened muscles. Contact your doctor or a physical therapist for more information on knee rehabilitation.    CONSTIPATION  Constipation is defined medically as fewer than three stools per week and severe constipation as less than one stool per week.  Even if you have a regular bowel pattern at home, your normal regimen is likely to be disrupted due to multiple reasons following surgery.  Combination of anesthesia, postoperative narcotics, change in appetite and fluid intake all can affect your bowels.   YOU MUST use at least one of the following options; they are listed in order of increasing strength to get the job done.  They are all available over the counter, and you may need to use some, POSSIBLY even all of these options:    Drink plenty of fluids (prune juice may be helpful) and high fiber foods Colace 100 mg by mouth twice a day  Senokot for constipation as directed and as needed Dulcolax (bisacodyl), take with full glass of water  Miralax (polyethylene glycol) once or twice a day as needed.  If you have tried all these things and are unable to have a bowel movement in the first 3-4 days after surgery call either your surgeon or your primary doctor.    If you experience loose stools or diarrhea, hold the medications until you stool forms back up.  If your symptoms do not get better within 1 week or if they get worse, check with your doctor.  If you experience "the worst abdominal pain ever" or develop nausea or vomiting, please contact the office immediately for further  recommendations for treatment.   ITCHING:  If you experience itching with your medications, try taking only a single pain pill, or even half a pain pill at a time.  You can also use Benadryl over the counter for itching or also to help with sleep.   TED HOSE STOCKINGS:  Use stockings on both legs until for at least 2 weeks or as directed by physician office. They may be removed at night for sleeping.  MEDICATIONS:  See your medication summary on the "After Visit Summary" that nursing will review with you.  You may have some home medications which will be placed on hold until you complete the course of blood thinner medication.  It is important for you to complete the blood thinner medication as prescribed.  PRECAUTIONS:  If you experience chest pain or shortness of breath - call 911 immediately for transfer to the hospital  emergency department.   If you develop a fever greater that 101 F, purulent drainage from wound, increased redness or drainage from wound, foul odor from the wound/dressing, or calf pain - CONTACT YOUR SURGEON.                                                   FOLLOW-UP APPOINTMENTS:  If you do not already have a post-op appointment, please call the office for an appointment to be seen by your surgeon.  Guidelines for how soon to be seen are listed in your "After Visit Summary", but are typically between 1-4 weeks after surgery.  OTHER INSTRUCTIONS:   Knee Replacement:  Do not place pillow under knee, focus on keeping the knee straight while resting. CPM instructions: 0-90 degrees, 2 hours in the morning, 2 hours in the afternoon, and 2 hours in the evening. Place foam block, curve side up under heel at all times except when in CPM or when walking.  DO NOT modify, tear, cut, or change the foam block in any way.  MAKE SURE YOU:  Understand these instructions.  Get help right away if you are not doing well or get worse.    Thank you for letting us be a part of your medical  care team.  It is a privilege we respect greatly.  We hope these instructions will help you stay on track for a fast and full recovery!     Increase activity slowly as tolerated    Complete by:  As directed            Follow-up Information    Follow up with Velna Ochs, MD In 2 weeks.   Specialty:  Orthopedic Surgery   Contact information:   [address] [place] [address] [phone]        Signed: Drema Halon 04/12/2015, 7:45 AM

## 2015-04-12 NOTE — Progress Notes (Signed)
Discharge instructions reviewed with the patient  Reviewed prescriptions, follow up appointments.  Voices understanding to teaching.  Patient [facility] [place]. Family to transport patient.To door via wheelchair. To [facility] via POV with family driving.

## 2015-04-12 NOTE — Progress Notes (Signed)
Subjective: 3 Days Post-Op Procedure(s) (LRB): TOTAL HIP ARTHROPLASTY ANTERIOR APPROACH (Right)   Patient is feeling much better this morning. His penile edema is decreasing. He was cleared by urology and will see them in about 1 month to make sure of resolution of symptoms.  Activity level:  wbat Diet tolerance:  ok Voiding:  ok Patient reports pain as mild.    Objective: Vital signs in last 24 hours: Temp:  [98 F (36.7 C)-100.1 F (37.8 C)] 98 F (36.7 C) (12/23 0527) Pulse Rate:  [98-106] 98 (12/23 0527) Resp:  [17-20] 17 (12/23 0527) BP: (113-125)/(59-67) 125/62 mmHg (12/23 0527) SpO2:  [94 %-97 %] 94 % (12/23 0527)  Labs:  Recent Labs  04/09/15 1422 04/10/15 0529 04/11/15 0614 04/12/15 0549  HGB 12.6* 10.6* 9.1* 8.5*    Recent Labs  04/11/15 0614 04/12/15 0549  WBC 10.7* 9.2  RBC 3.09* 2.96*  HCT 27.3* 26.3*  PLT 153 181    Recent Labs  04/09/15 1422 04/10/15 0529  NA 140 136  K 4.9 4.5  CL  --  103  CO2  --  23  BUN  --  19  CREATININE  --  1.30*  GLUCOSE  --  177*  CALCIUM  --  8.5*   No results for input(s): LABPT, INR in the last 72 hours.  Physical Exam:  Neurologically intact ABD soft Neurovascular intact Sensation intact distally Intact pulses distally Dorsiflexion/Plantar flexion intact Incision: dressing C/D/I and no drainage No cellulitis present Compartment soft  Assessment/Plan:  3 Days Post-Op Procedure(s) (LRB): TOTAL HIP ARTHROPLASTY ANTERIOR APPROACH (Right) Advance diet Up with therapy Discharge to SNF [facility] today. Contonue on ASA 325mg  BID x 4 weeks post op. Follow up in office 2 weeks post op. Follow up with urology in one month.  Angelo Prindle, Ginger OrganNDREW PAUL 04/12/2015, 7:41 AM

## 2015-04-12 NOTE — Clinical Social Work Placement (Signed)
   CLINICAL SOCIAL WORK PLACEMENT  NOTE  Date:  04/12/2015  Patient Details  Name: Joseph Bruce MRN: [id number] Date of Birth: December 05, 1955  Clinical Social Work is seeking post-discharge placement for this patient at the Skilled  Nursing Facility level of care (*CSW will initial, date and re-position this form in  chart as items are completed):  Yes   Patient/family provided with [facility] Clinical Social Work Department's list of facilities offering this level of care within the geographic area requested by the patient (or if unable, by the patient's family).  Yes   Patient/family informed of their freedom to choose among providers that offer the needed level of care, that participate in Medicare, Medicaid or managed care program needed by the patient, have an available bed and are willing to accept the patient.  Yes   Patient/family informed of [facility]'s ownership interest in [facility] and [facility], as well as of the fact that they are under no obligation to receive care at these facilities.  PASRR submitted to EDS on 04/11/15     PASRR number received on 04/11/15     Existing PASRR number confirmed on       FL2 transmitted to all facilities in geographic area requested by pt/family on 04/11/15     FL2 transmitted to all facilities within larger geographic area on       Patient informed that his/her managed care company has contracts with or will negotiate with certain facilities, including the following:        Yes   Patient/family informed of bed offers received.  [facility]     Physician recommends and patient chooses bed at      Patient to be transferred to [facility] [place] on 04/12/15.  Patient to be transferred to facility by [facility]     Patient family notified on 04/12/15 of transfer.  Name of family member notified:  Patient     PHYSICIAN Please sign FL2     Additional Comment:     _______________________________________________ Rod MaeVaughn, Imran Nuon S, LCSW 04/12/2015, 10:52 AM

## 2015-04-12 NOTE — Clinical Social Work Note (Signed)
CSW contacted by RN. Patient and patient's family now requesting patient to be transported to SNF via car. CSW updated [facility], patient to be transported via car.  Marcelline Deistmily Strother Everitt, LCSW [phone] Orthopedics: [phone] Surgical: [phone]

## 2015-06-06 DIAGNOSIS — M6702 Short Achilles tendon (acquired), left ankle: Secondary | ICD-10-CM | POA: Insufficient documentation

## 2015-06-06 DIAGNOSIS — M7750 Other enthesopathy of unspecified foot: Secondary | ICD-10-CM | POA: Insufficient documentation

## 2015-06-06 HISTORY — DX: Other enthesopathy of unspecified foot and ankle: M77.50

## 2015-06-06 HISTORY — DX: Short Achilles tendon (acquired), left ankle: M67.02

## 2016-02-22 IMAGING — RF DG HIP (WITH PELVIS) OPERATIVE*R*
1 series · 2 of 2 positions shown · non-contrast
Comparison: If MRI 04/18/2013

CLINICAL DATA: Patient status post right hip replacement.

EXAM:
OPERATIVE RIGHT HIP (WITH PELVIS IF PERFORMED) 2 VIEWS
TECHNIQUE: Fluoroscopic spot image(s) were submitted for interpretation
post-operatively.

[Series 1: run · 2 of 2 slices shown]
[im 1/2]
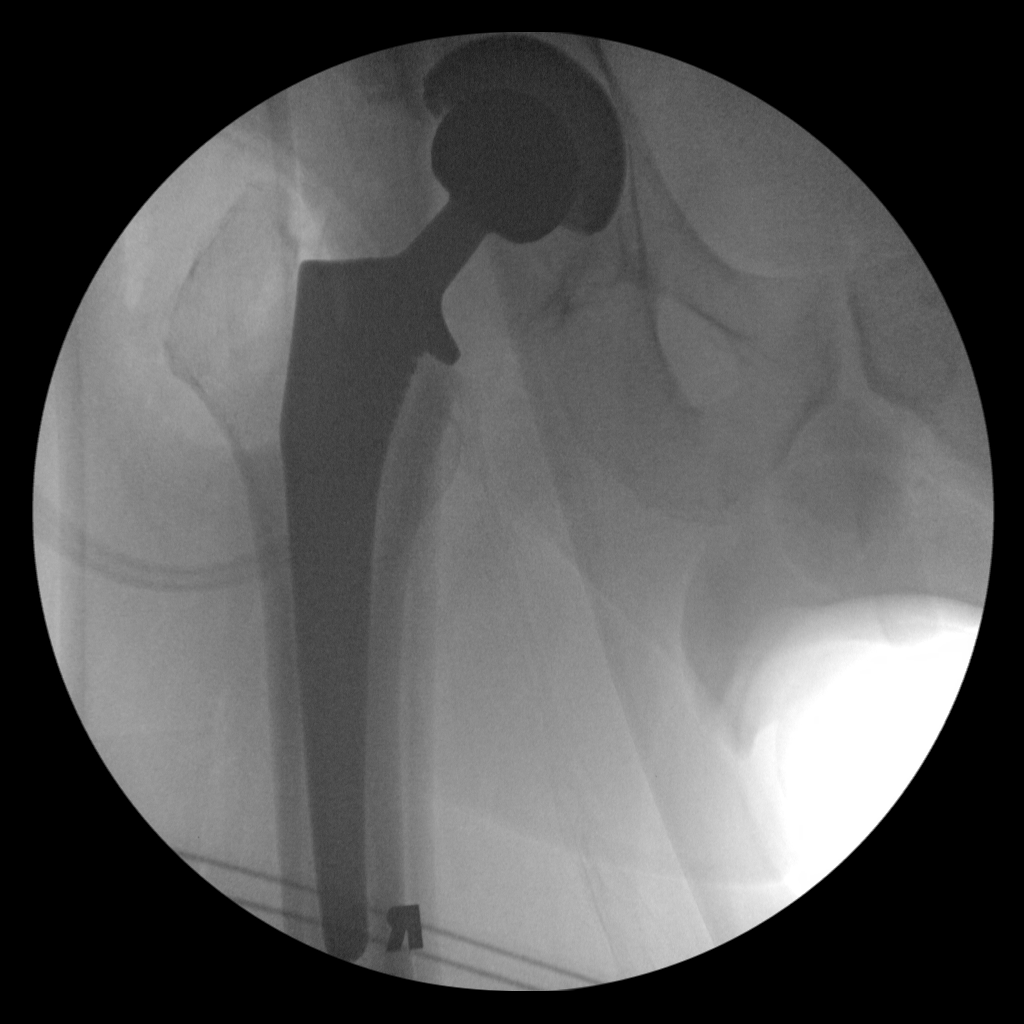
[im 2/2]
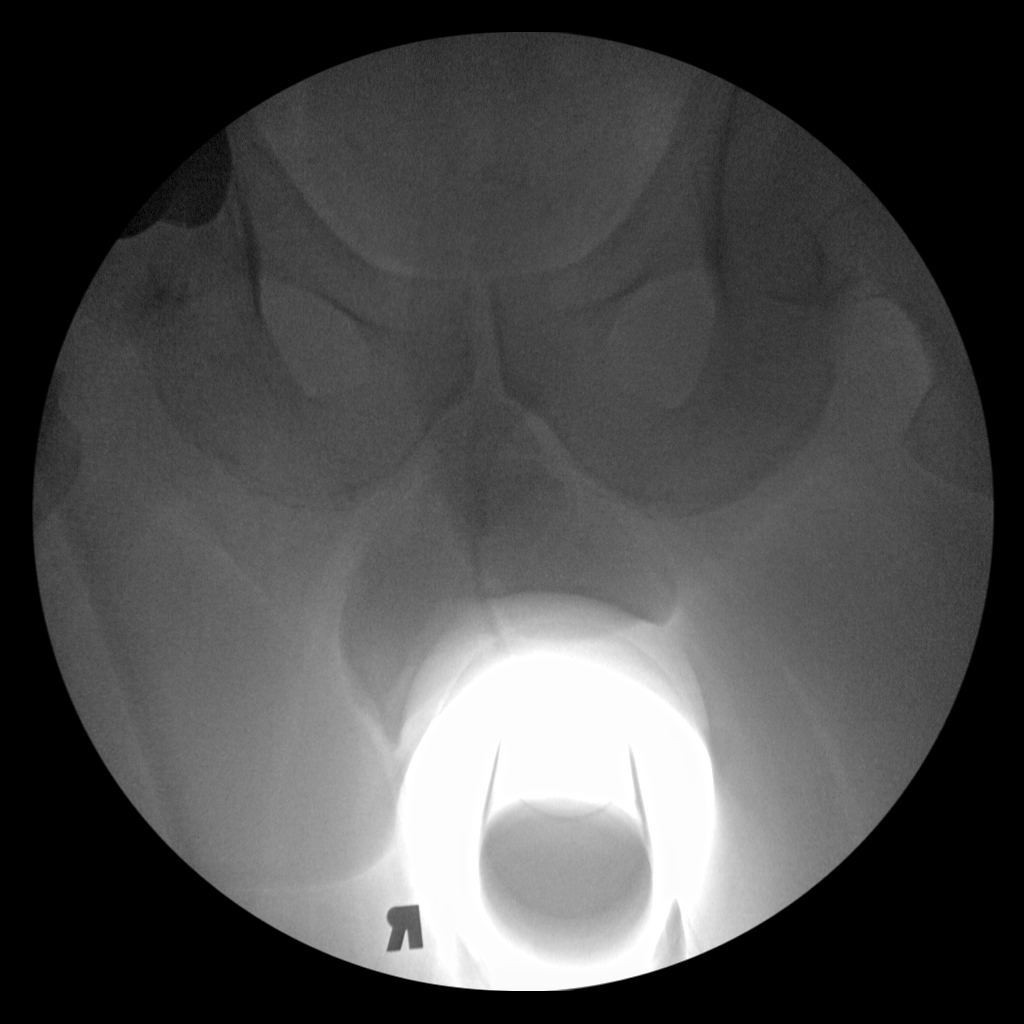

[2 of 2 positions shown; findings below may reference images not displayed]

FINDINGS: 2 intraoperative fluoroscopic images were submitted. Findings
demonstrate patient to be status post right hip arthroplasty.
Hardware appears intact.
IMPRESSION: Patient status post right hip arthroplasty.

## 2016-10-07 DIAGNOSIS — G4733 Obstructive sleep apnea (adult) (pediatric): Secondary | ICD-10-CM | POA: Diagnosis not present

## 2016-10-07 DIAGNOSIS — I10 Essential (primary) hypertension: Secondary | ICD-10-CM | POA: Diagnosis not present

## 2016-10-07 DIAGNOSIS — I2699 Other pulmonary embolism without acute cor pulmonale: Secondary | ICD-10-CM | POA: Diagnosis not present

## 2016-10-07 DIAGNOSIS — E785 Hyperlipidemia, unspecified: Secondary | ICD-10-CM | POA: Diagnosis not present

## 2016-10-08 DIAGNOSIS — I10 Essential (primary) hypertension: Secondary | ICD-10-CM | POA: Diagnosis not present

## 2016-10-08 DIAGNOSIS — E785 Hyperlipidemia, unspecified: Secondary | ICD-10-CM | POA: Diagnosis not present

## 2016-10-08 DIAGNOSIS — I2699 Other pulmonary embolism without acute cor pulmonale: Secondary | ICD-10-CM | POA: Diagnosis not present

## 2016-10-08 DIAGNOSIS — G4733 Obstructive sleep apnea (adult) (pediatric): Secondary | ICD-10-CM | POA: Diagnosis not present

## 2016-11-06 ENCOUNTER — Encounter: Payer: Self-pay | Admitting: Vascular Surgery

## 2016-11-13 ENCOUNTER — Other Ambulatory Visit: Payer: Self-pay

## 2016-11-13 ENCOUNTER — Encounter: Payer: Self-pay | Admitting: Vascular Surgery

## 2016-11-13 ENCOUNTER — Ambulatory Visit (INDEPENDENT_AMBULATORY_CARE_PROVIDER_SITE_OTHER): Payer: BLUE CROSS/BLUE SHIELD | Admitting: Vascular Surgery

## 2016-11-13 VITALS — BP 125/84 | HR 76 | Temp 97.2°F | Resp 19 | Ht 73.0 in | Wt 329.0 lb

## 2016-11-13 DIAGNOSIS — I87002 Postthrombotic syndrome without complications of left lower extremity: Secondary | ICD-10-CM

## 2016-11-13 NOTE — Progress Notes (Signed)
Patient ID: Joseph Bruce, male   DOB: June 07, 1955, 61 y.o.   MRN: [id number]  ReasLorn Juneson for Consult: New Evaluation (eval for IVC stent placement- ref by Dr. 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 He was diagnosed with a significant left lower extremity DVT in June of this year and has been maintained on liquids since. Unfortunately he has complications of significant left lower extremity swelling despite compression stockings. He continues to walk but this does cause him pain. He also has mild pain and tenderness to touching the leg the leg also feels heavy and he has some skin changes as well. He does not have a personal or family history of DVT prior to this. He does have a history of having a colonoscopy he thinks 8 years ago which was negative and he is not due for another 2 years. He has not had any constitutional symptoms or weight loss and in fact has continued to gain approximately 4 pounds per year. Other than his left lower extremity he continues to be his normal level of activity and is walking more now that he has been diagnosed DVT. He continues to drive a truck.  Past Medical History:  Diagnosis Date  . Arthritis   . DVT (deep venous thrombosis) (HCC)   . GERD (gastroesophageal reflux disease)    ulcer  . Hypertension   . Serum lipids high   . Sleep apnea    History reviewed. No pertinent family history. Past Surgical History:  Procedure Laterality Date  . COLONOSCOPY    . TOTAL HIP ARTHROPLASTY Right 04/09/2015   Procedure: TOTAL HIP ARTHROPLASTY ANTERIOR APPROACH;  Surgeon: Marcene CorningPeter Dalldorf, MD;  Location: [facility] OR;  Service: Orthopedics;  Laterality: Right;  . WISDOM TOOTH EXTRACTION      Short Social History:  Social History  Substance Use Topics  . Smoking status: Never Smoker  . Smokeless tobacco: Former NeurosurgeonUser      Types: Chew  . Alcohol use Yes     Comment: occ    No Known Allergies  Current Outpatient Prescriptions  Medication Sig Dispense Refill  . furosemide (LASIX) 20 MG tablet     . HYDROcodone-acetaminophen (NORCO/VICODIN) 5-325 MG tablet Take 1-2 tablets by mouth every 4 (four) hours as needed (breakthrough pain). 50 tablet 0  . lisinopril (PRINIVIL,ZESTRIL) 10 MG tablet Take 10 mg by mouth daily.  1  . Multiple Vitamin (MULTIVITAMIN WITH MINERALS) TABS tablet Take 1 tablet by mouth daily. Centrum Silver 50 plus    . Omega-3 Fatty Acids (FISH OIL) 1200 MG CAPS Take 2,400 mg by mouth daily.    Marland Kitchen. omeprazole (PRILOSEC OTC) 20 MG tablet Take 20 mg by mouth daily before breakfast.    . simvastatin (ZOCOR) 20 MG tablet Take 20 mg by mouth at bedtime.  0  . aspirin EC 325 MG EC tablet Take 1 tablet (325 mg total) by mouth 2 (two) times daily after a meal. (Patient not taking: Reported on 11/13/2016) 60 tablet 0  . methocarbamol (ROBAXIN) 500 MG tablet Take 1 tablet (500 mg total) by mouth every 6 (six) hours as needed for muscle spasms. (Patient not taking: Reported on 11/13/2016) 50 tablet 0   No current facility-administered medications for this visit.     Review of Systems  Constitutional:  Constitutional negative.  HENT: HENT negative.  Eyes: Eyes negative.  Respiratory: Positive for shortness of breath.  Cardiovascular: Positive for dyspnea with exertion and leg swelling.  GI: Gastrointestinal negative.  Musculoskeletal: Positive for leg pain.  Skin: Skin negative.  Neurological: Neurological negative. Hematologic: Hematologic/lymphatic negative.       Blood clotting Psychiatric: Psychiatric negative.        Objective:  Objective   Vitals:   11/13/16 1524  BP: 125/84  Pulse: 76  Resp: 19  Temp: (!) 97.2 F (36.2 C)  TempSrc: Oral  SpO2: 98%  Weight: (!) 329 lb (149.2 kg)  Height: 6\' 1"  (1.854 m)   Body mass index is 43.41 kg/m.  Physical Exam  Data: I reviewed  his previous DVT study which demonstrated DVT in his femoral and popliteal veins only. There was no evaluation of his central venous system.     Assessment/Plan:    61 year old male with recent history of left lower extremity DVT that has left him with mild post-thrombotic with villalta score of 7. 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 He does have up-to-date cancer screenings and no symptoms of cancer. He is obese and has gained 4 pounds per year and this may be his main contributing factor. Given that is the left leg and significant of the swelling certainly there is concern for underlying venous obstruction in his central veins. This would be impossible to evaluate with ultrasound given his size and we discussed this. His options include continued anticoagulation with ambulation and compression versus interventional procedure to further evaluate his central venous system and possibly treat him as well. He wants to go with the latter and from that standpoint we will proceed with left lower extremity a sending venography with possible intravascular ultrasound and possible stenting or angioplasty were indicated. He demonstrated very good understanding. We will hold his own request for 1 day prior to the procedure.     Maeola HarmanBrandon Christopher Jnae Thomaston MD [facility]

## 2016-11-16 ENCOUNTER — Ambulatory Visit (HOSPITAL_COMMUNITY)
Admission: AD | Admit: 2016-11-16 | Discharge: 2016-11-16 | Disposition: A | Discharge status: Home / Self Care | Payer: BLUE CROSS/BLUE SHIELD | Source: Ambulatory Visit | Attending: Vascular Surgery | Admitting: Vascular Surgery

## 2016-11-16 ENCOUNTER — Encounter (HOSPITAL_COMMUNITY)
Admission: AD | Disposition: A | Discharge status: Home / Self Care | Payer: Self-pay | Source: Ambulatory Visit | Attending: Vascular Surgery

## 2016-11-16 DIAGNOSIS — Z538 Procedure and treatment not carried out for other reasons: Secondary | ICD-10-CM | POA: Insufficient documentation

## 2016-11-16 DIAGNOSIS — I739 Peripheral vascular disease, unspecified: Secondary | ICD-10-CM | POA: Diagnosis not present

## 2016-11-16 DIAGNOSIS — Z992 Dependence on renal dialysis: Secondary | ICD-10-CM

## 2016-11-16 DIAGNOSIS — Z95828 Presence of other vascular implants and grafts: Secondary | ICD-10-CM

## 2016-11-16 LAB — POCT I-STAT, CHEM 8
BUN: 13 mg/dL (ref 6–20)
CALCIUM ION: 1.25 mmol/L (ref 1.15–1.40)
CHLORIDE: 109 mmol/L (ref 101–111)
CREATININE: 1 mg/dL (ref 0.61–1.24)
GLUCOSE: 96 mg/dL (ref 65–99)
HCT: 36 % — ABNORMAL LOW (ref 39.0–52.0)
Hemoglobin: 12.2 g/dL — ABNORMAL LOW (ref 13.0–17.0)
Potassium: 4.3 mmol/L (ref 3.5–5.1)
SODIUM: 143 mmol/L (ref 135–145)
TCO2: 26 mmol/L (ref 0–100)

## 2016-11-16 SURGERY — LOWER EXTREMITY VENOGRAPHY
Anesthesia: LOCAL | Laterality: Left

## 2016-11-16 MED ORDER — POTASSIUM CHLORIDE CRYS ER 20 MEQ PO TBCR
20.0000 meq | EXTENDED_RELEASE_TABLET | Freq: Once | ORAL | Status: DC
  Filled 2016-11-16: qty 2

## 2016-11-16 MED ORDER — SODIUM CHLORIDE 0.9 % IV SOLN
INTRAVENOUS | Status: DC
  Administered 2016-11-16: 11:00:00 via INTRAVENOUS

## 2016-11-16 MED ORDER — ALUM & MAG HYDROXIDE-SIMETH 200-200-20 MG/5ML PO SUSP: 15.0000 mL | ORAL | Status: DC | PRN

## 2016-11-16 MED ORDER — GUAIFENESIN-DM 100-10 MG/5ML PO SYRP: 15.0000 mL | ORAL_SOLUTION | ORAL | Status: DC | PRN

## 2016-11-16 MED ORDER — LACTATED RINGERS IV SOLN: INTRAVENOUS | Status: DC

## 2016-11-16 MED ORDER — PANTOPRAZOLE SODIUM 40 MG PO TBEC
40.0000 mg | DELAYED_RELEASE_TABLET | Freq: Every day | ORAL | Status: DC
  Filled 2016-11-16 (×2): qty 1

## 2016-11-16 MED ORDER — METOPROLOL TARTRATE 5 MG/5ML IV SOLN: 2.0000 mg | INTRAVENOUS | Status: DC | PRN

## 2016-11-16 MED ORDER — LABETALOL HCL 5 MG/ML IV SOLN: 10.0000 mg | INTRAVENOUS | Status: DC | PRN

## 2016-11-16 MED ORDER — HYDRALAZINE HCL 20 MG/ML IJ SOLN: 5.0000 mg | INTRAMUSCULAR | Status: DC | PRN

## 2016-11-16 MED ORDER — PHENOL 1.4 % MT LIQD: 1.0000 | OROMUCOSAL | Status: DC | PRN

## 2016-11-16 MED ORDER — ONDANSETRON HCL 4 MG/2ML IJ SOLN: 4.0000 mg | Freq: Four times a day (QID) | INTRAMUSCULAR | Status: DC | PRN

## 2016-11-17 NOTE — H&P (Signed)
   Discussed with patient that given ongoing case we will not be able to get to his procedure on Monday 7.30. He demonstrates understanding and wants a note for work. Will get rescheduled in near future.   Brandon C. Randie Heinzain, MD [facility] [place] Office: [phone] Pager: [phone]

## 2016-11-23 ENCOUNTER — Encounter: Payer: Self-pay | Admitting: Internal Medicine

## 2016-11-26 ENCOUNTER — Other Ambulatory Visit: Payer: Self-pay

## 2016-11-30 ENCOUNTER — Observation Stay (HOSPITAL_COMMUNITY)
Admission: RE | Admit: 2016-11-30 | Discharge: 2016-12-01 | Disposition: A | Discharge status: Home / Self Care | Payer: BLUE CROSS/BLUE SHIELD | Source: Ambulatory Visit | Attending: Vascular Surgery | Admitting: Vascular Surgery

## 2016-11-30 ENCOUNTER — Encounter (HOSPITAL_COMMUNITY)
Admission: RE | Disposition: A | Discharge status: Home / Self Care | Payer: Self-pay | Source: Ambulatory Visit | Attending: Vascular Surgery

## 2016-11-30 ENCOUNTER — Encounter (HOSPITAL_COMMUNITY): Payer: Self-pay | Admitting: Vascular Surgery

## 2016-11-30 DIAGNOSIS — I739 Peripheral vascular disease, unspecified: Secondary | ICD-10-CM | POA: Insufficient documentation

## 2016-11-30 DIAGNOSIS — I82432 Acute embolism and thrombosis of left popliteal vein: Secondary | ICD-10-CM | POA: Insufficient documentation

## 2016-11-30 DIAGNOSIS — I82412 Acute embolism and thrombosis of left femoral vein: Secondary | ICD-10-CM | POA: Insufficient documentation

## 2016-11-30 DIAGNOSIS — I10 Essential (primary) hypertension: Secondary | ICD-10-CM | POA: Diagnosis not present

## 2016-11-30 DIAGNOSIS — M1611 Unilateral primary osteoarthritis, right hip: Secondary | ICD-10-CM | POA: Insufficient documentation

## 2016-11-30 DIAGNOSIS — E669 Obesity, unspecified: Secondary | ICD-10-CM | POA: Diagnosis not present

## 2016-11-30 DIAGNOSIS — Z6836 Body mass index (BMI) 36.0-36.9, adult: Secondary | ICD-10-CM | POA: Diagnosis not present

## 2016-11-30 DIAGNOSIS — I82409 Acute embolism and thrombosis of unspecified deep veins of unspecified lower extremity: Secondary | ICD-10-CM | POA: Diagnosis present

## 2016-11-30 DIAGNOSIS — I87002 Postthrombotic syndrome without complications of left lower extremity: Secondary | ICD-10-CM | POA: Diagnosis not present

## 2016-11-30 HISTORY — PX: PERIPHERAL VASCULAR BALLOON ANGIOPLASTY: CATH118281

## 2016-11-30 HISTORY — PX: LOWER EXTREMITY VENOGRAPHY: CATH118253

## 2016-11-30 LAB — POCT I-STAT, CHEM 8
BUN: 14 mg/dL (ref 6–20)
CHLORIDE: 107 mmol/L (ref 101–111)
CREATININE: 1 mg/dL (ref 0.61–1.24)
Calcium, Ion: 1.26 mmol/L (ref 1.15–1.40)
Glucose, Bld: 97 mg/dL (ref 65–99)
HEMATOCRIT: 39 % (ref 39.0–52.0)
Hemoglobin: 13.3 g/dL (ref 13.0–17.0)
Potassium: 4.1 mmol/L (ref 3.5–5.1)
SODIUM: 143 mmol/L (ref 135–145)
TCO2: 25 mmol/L (ref 0–100)

## 2016-11-30 LAB — HEPARIN LEVEL (UNFRACTIONATED): HEPARIN UNFRACTIONATED: 0.36 [IU]/mL (ref 0.30–0.70)

## 2016-11-30 LAB — POCT ACTIVATED CLOTTING TIME

## 2016-11-30 LAB — APTT: APTT: 61 s — AB (ref 24–36)

## 2016-11-30 SURGERY — LOWER EXTREMITY VENOGRAPHY
Anesthesia: LOCAL | Laterality: Left

## 2016-11-30 MED ORDER — LIDOCAINE HCL (PF) 1 % IJ SOLN
INTRAMUSCULAR | Status: AC
  Filled 2016-11-30: qty 30

## 2016-11-30 MED ORDER — ACETAMINOPHEN 325 MG PO TABS: 650.0000 mg | ORAL_TABLET | ORAL | Status: DC | PRN

## 2016-11-30 MED ORDER — SODIUM CHLORIDE 0.9 % IV SOLN
INTRAVENOUS | Status: DC
  Administered 2016-11-30 – 2016-12-01 (×3): via INTRAVENOUS

## 2016-11-30 MED ORDER — ONDANSETRON HCL 4 MG/2ML IJ SOLN: 4.0000 mg | Freq: Four times a day (QID) | INTRAMUSCULAR | Status: DC | PRN

## 2016-11-30 MED ORDER — IODIXANOL 320 MG/ML IV SOLN
INTRAVENOUS | Status: DC | PRN
  Administered 2016-11-30: 45 mL via INTRAVENOUS

## 2016-11-30 MED ORDER — MIDAZOLAM HCL 2 MG/2ML IJ SOLN
INTRAMUSCULAR | Status: DC | PRN
  Administered 2016-11-30: 2 mg via INTRAVENOUS

## 2016-11-30 MED ORDER — HYDROCODONE-ACETAMINOPHEN 5-325 MG PO TABS: 1.0000 | ORAL_TABLET | ORAL | Status: DC | PRN

## 2016-11-30 MED ORDER — HEPARIN SODIUM (PORCINE) 1000 UNIT/ML IJ SOLN
INTRAMUSCULAR | Status: AC
  Filled 2016-11-30: qty 2

## 2016-11-30 MED ORDER — FENTANYL CITRATE (PF) 100 MCG/2ML IJ SOLN
INTRAMUSCULAR | Status: DC | PRN
  Administered 2016-11-30: 50 ug via INTRAVENOUS

## 2016-11-30 MED ORDER — LIDOCAINE HCL (PF) 1 % IJ SOLN
INTRAMUSCULAR | Status: DC | PRN
  Administered 2016-11-30: 15 mL via SUBCUTANEOUS

## 2016-11-30 MED ORDER — SODIUM CHLORIDE 0.9 % IV SOLN
INTRAVENOUS | Status: DC
  Administered 2016-11-30: 06:00:00 via INTRAVENOUS

## 2016-11-30 MED ORDER — LISINOPRIL 10 MG PO TABS
10.0000 mg | ORAL_TABLET | Freq: Every day | ORAL | Status: DC
  Administered 2016-12-01: 10 mg via ORAL
  Filled 2016-11-30: qty 1

## 2016-11-30 MED ORDER — HEPARIN (PORCINE) IN NACL 2-0.9 UNIT/ML-% IJ SOLN
INTRAMUSCULAR | Status: AC | PRN
  Administered 2016-11-30: 500 mL

## 2016-11-30 MED ORDER — MIDAZOLAM HCL 2 MG/2ML IJ SOLN
INTRAMUSCULAR | Status: AC
  Filled 2016-11-30: qty 2

## 2016-11-30 MED ORDER — PANTOPRAZOLE SODIUM 20 MG PO TBEC
20.0000 mg | DELAYED_RELEASE_TABLET | Freq: Every day | ORAL | Status: DC
  Administered 2016-12-01: 20 mg via ORAL
  Filled 2016-11-30 (×2): qty 1

## 2016-11-30 MED ORDER — HEPARIN SODIUM (PORCINE) 1000 UNIT/ML IJ SOLN
INTRAMUSCULAR | Status: DC | PRN
  Administered 2016-11-30: 12000 [IU] via INTRAVENOUS

## 2016-11-30 MED ORDER — OXYCODONE-ACETAMINOPHEN 5-325 MG PO TABS: 1.0000 | ORAL_TABLET | ORAL | Status: DC | PRN

## 2016-11-30 MED ORDER — METHOCARBAMOL 500 MG PO TABS: 500.0000 mg | ORAL_TABLET | Freq: Four times a day (QID) | ORAL | Status: DC | PRN

## 2016-11-30 MED ORDER — ADULT MULTIVITAMIN W/MINERALS CH
1.0000 | ORAL_TABLET | Freq: Every day | ORAL | Status: DC
  Administered 2016-11-30 – 2016-12-01 (×2): 1 via ORAL
  Filled 2016-11-30 (×2): qty 1

## 2016-11-30 MED ORDER — SIMVASTATIN 20 MG PO TABS
20.0000 mg | ORAL_TABLET | Freq: Every day | ORAL | Status: DC
  Administered 2016-11-30: 20 mg via ORAL
  Filled 2016-11-30: qty 1

## 2016-11-30 MED ORDER — FENTANYL CITRATE (PF) 100 MCG/2ML IJ SOLN
INTRAMUSCULAR | Status: AC
  Filled 2016-11-30: qty 2

## 2016-11-30 MED ORDER — MORPHINE SULFATE (PF) 2 MG/ML IV SOLN: 2.0000 mg | INTRAVENOUS | Status: DC | PRN

## 2016-11-30 MED ORDER — HEPARIN (PORCINE) IN NACL 100-0.45 UNIT/ML-% IJ SOLN
1700.0000 [IU]/h | INTRAMUSCULAR | Status: AC
  Administered 2016-11-30: 1600 [IU]/h via INTRAVENOUS
  Administered 2016-12-01: 1700 [IU]/h via INTRAVENOUS
  Filled 2016-11-30 (×2): qty 250

## 2016-11-30 MED ORDER — ASPIRIN EC 325 MG PO TBEC
325.0000 mg | DELAYED_RELEASE_TABLET | Freq: Two times a day (BID) | ORAL | Status: DC
  Administered 2016-11-30: 325 mg via ORAL
  Filled 2016-11-30 (×2): qty 1

## 2016-11-30 MED ORDER — HEPARIN (PORCINE) IN NACL 2-0.9 UNIT/ML-% IJ SOLN
INTRAMUSCULAR | Status: AC
  Filled 2016-11-30: qty 500

## 2016-11-30 SURGICAL SUPPLY — 23 items
BAG SNAP BAND KOVER 36X36 (MISCELLANEOUS) ×4 IMPLANT
BALLN ARMADA 10X60X80 (BALLOONS) ×2
BALLOON ARMADA 10X60X80 (BALLOONS) ×1 IMPLANT
CATH ANGIO 5F BER2 65CM (CATHETERS) ×2 IMPLANT
CATH INFINITI VERT 5FR 125CM (CATHETERS) ×2 IMPLANT
CATH VISIONS PV .035 IVUS (CATHETERS) ×2 IMPLANT
COVER DOME SNAP 22 D (MISCELLANEOUS) ×2 IMPLANT
COVER PRB 48X5XTLSCP FOLD TPE (BAG) ×1 IMPLANT
COVER PROBE 5X48 (BAG) ×1
DEVICE TORQUE .025-.038 (MISCELLANEOUS) ×2 IMPLANT
GUIDEWIRE ANGLED .035X260CM (WIRE) ×2 IMPLANT
KIT ENCORE 26 ADVANTAGE (KITS) ×2 IMPLANT
KIT MICROINTRODUCER STIFF 5F (SHEATH) ×2 IMPLANT
KIT PV (KITS) ×2 IMPLANT
PROTECTION STATION PRESSURIZED (MISCELLANEOUS) ×2
SET ZELANTE DVT THROMB (CATHETERS) ×2 IMPLANT
SHEATH PINNACLE 5F 10CM (SHEATH) ×4 IMPLANT
SHEATH PINNACLE 9F 10CM (SHEATH) ×2 IMPLANT
STATION PROTECTION PRESSURIZED (MISCELLANEOUS) ×1 IMPLANT
TRANSDUCER W/STOPCOCK (MISCELLANEOUS) ×2 IMPLANT
TRAY PV CATH (CUSTOM PROCEDURE TRAY) ×2 IMPLANT
WIRE AMPLATZ SS-J .035X260CM (WIRE) ×4 IMPLANT
WIRE BENTSON .035X145CM (WIRE) ×4 IMPLANT

## 2016-11-30 NOTE — Op Note (Signed)
    Patient name: Joseph Bruce MRN: [id number] DOB: [birth year] Sex: male  11/30/2016 Pre-operative Diagnosis: post-thrombotic syndrome left lower extremity Post-operative diagnosis:  Same Surgeon:  Apolinar JunesBrandon C. Randie Heinzain, MD Procedure Performed: 1.  US guided cannulation of left lesser saphenous and popliteal veins 2.  Left lower extremity ascending venography 3.  Central venogram 4.  IVUS of left popliteal, femoral, common femoral, external iliac, common iliac veins and ivc 5.  Angioplasty of left popliteal and femoral veins with 10mm balloon 6.  Moderate sedation with fentanyl and versed for 74 minutes 7.  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 He now has significant post-thrombotic syndrome and was indicated for venogram with possible intervention.  Findings: Popliteal and femoral veins were occluded in the mid distal thigh area. He has normal cephalad femoral vein, femoral vein and central veins. Following intervention he now has a channel angiographically through his popliteal and femoral veins.   Procedure:  The patient was identified in the holding area and taken to room 8.  The patient was then placed supine on the table and prepped and draped in the usual sterile fashion.  A time out was called.  Ultrasound was used to evaluate initially the left lesser saphenous vein which was noted to be noncompressible. We cannulated this with micropuncture needle passed a wire and placed a micropuncture sheath. I then placed a Glidewire and was able to get a 5 [place] sheath. Venogram demonstrated we had perforated the vein as I could no longer make a channel to the femoral vein. I then re-use ultrasound identify the popliteal vein and cannulated this with 18-gauge needle and placed a Bentson wire. I then placed a  another 5 Jamaica[place] sheath and removed the 5 French sheath from the lesser saphenous vein. Using bare catheter and Glidewire I was able to cross the occluded segments into open femoral vein and I confirmed this being graphically. I then placed a bare catheter into the external iliac vein and performed venogram centrally demonstrated patency. An Amplatz wire was then placed into the right subclavian vein. IVIS was performed from the popliteal all the way through the IVC which demonstrated only occluded segments in the left femoral and popliteal veins and no identification of any central stenosis. I measured the normal femoral vein at 10 mm approximately. I then performed balloon angioplasty of the entire femoral popliteal segment followed by AngioJet and repeated the IVUS. I then repeated AngioJet up to a total of 163 seconds and repeated balloon angioplasty throughout the femoral popliteal segment as well. At completion IVIS there was a small channel through the femoral popliteal segment and venogram demonstrated antegrade flow. Hopefully this will give him a channel to remove some of his swelling in his left lower leg given that it is at the very important above-knee popliteal segment. I'll keep him on heparin overnight restart his eliquis in the morning. He tolerated procedure well without immediate complications.   Contrast: 45cc  Contrast:  Montrice Gracey C. Randie Heinzain, MD [facility] [place] [facility] Office: [phone] Pager: [phone]

## 2016-11-30 NOTE — Progress Notes (Signed)
ANTICOAGULATION CONSULT NOTE - Initial Consult  Pharmacy Consult for Heparin Indication: hx DVT in June 2018  No Known Allergies  Patient Measurements: Height: 6\' 4"  (193 cm) Weight: 300 lb (136.1 kg) IBW/kg (Calculated) : 86.8 Heparin Dosing Weight: 117 kg  Vital Signs: Temp: 98.5 F (36.9 C) (08/13 2051) Temp Source: Oral (08/13 2051) BP: 132/77 (08/13 1140) Pulse Rate: 70 (08/13 2051)  Labs:  Recent Labs  11/30/16 0601 11/30/16 2142  HGB 13.3  --   HCT 39.0  --   APTT  --  61*  HEPARINUNFRC  --  0.36  CREATININE 1.00  --     Estimated Creatinine Clearance: 119.8 mL/min (by C-G formula based on SCr of 1 mg/dL).   Medical History: Past Medical History:  Diagnosis Date  . Arthritis   . DVT (deep venous thrombosis) (HCC)   . GERD (gastroesophageal reflux disease)    ulcer  . Hypertension   . Serum lipids high   . Sleep apnea    Assessment:  61 yr old male with hx DVT June 2018, lower extremity procedure including pharmacologic thrombolysis with angiojet.  Home Eliquis on hold for procedure, last dose 8/11 am.   To begin IV heparin 4 hrs after sheath out, then resume Eliquis on 8/14.     He is currently on heparin 1600 units/hr, heparin level 0.36, aPTT 61 slightly subtherapeutic.    Goal of Therapy:  Heparin level 0.3-0.7 units/ml aPTT 66-102 seconds Monitor platelets by anticoagulation protocol: Yes   Plan:  Increase heparin rate slightly to 1700 units/hr F/u AM labs Follow up resuming Eliquis on 8/14.  Bayard HuggerMei Itzael Liptak, PharmD, BCPS  Clinical Pharmacist  Pager: [phone]   11/30/2016,10:14 PM

## 2016-11-30 NOTE — Progress Notes (Signed)
Removal of Left Femoral Popliteal Venous 9Fr. Sheath without complication. Manual compression applied for 35 minutes. Site stable with no oozing, bruising or hematoma present. Extremity still has edema to lower leg with minimal flushing. Sterile 4x4 Tegaderm applied to site. Site dry and intact. Bp" 120 / 62, Hr: 54, Sp02: 98%.

## 2016-11-30 NOTE — H&P (Signed)
   History and Physical Update  The patient was interviewed and re-examined.  The patient's previous History and Physical has been reviewed and is unchanged from office visit. Plan lle venogram with possible intervention.    Swan Zayed C. Randie Heinzain, MD [facility] [place] Office: [phone] Pager: [phone]   11/30/2016, 7:22 AM

## 2016-11-30 NOTE — Progress Notes (Signed)
ANTICOAGULATION CONSULT NOTE - Initial Consult  Pharmacy Consult for Heparin Indication: hx DVT in June 2018  No Known Allergies  Patient Measurements: Height: 6\' 4"  (193 cm) Weight: 300 lb (136.1 kg) IBW/kg (Calculated) : 86.8 Heparin Dosing Weight: 117 kg  Vital Signs: Temp: 97.5 F (36.4 C) (08/13 1140) Temp Source: Oral (08/13 1140) BP: 132/77 (08/13 1140) Pulse Rate: 58 (08/13 1140)  Labs:  Recent Labs  11/30/16 0601  HGB 13.3  HCT 39.0  CREATININE 1.00    Estimated Creatinine Clearance: 119.8 mL/min (by C-G formula based on SCr of 1 mg/dL).   Medical History: Past Medical History:  Diagnosis Date  . Arthritis   . DVT (deep venous thrombosis) (HCC)   . GERD (gastroesophageal reflux disease)    ulcer  . Hypertension   . Serum lipids high   . Sleep apnea    Assessment:  61 yr old male with hx DVT June 2018, lower extremity procedure including pharmacologic thrombolysis with angiojet.  Home Eliquis on hold for procedure, last dose 8/11 am.   To begin IV heparin 4 hrs after sheath out, then resume Eliquis on 8/14.        Sheath out ~11:15 am, pressure held x 35 minutes.  No bleeding or hematoma noted.    Will plan to check heparin level and aPTTs, as recent Eliquis doses may effect heparin levels.  Goal of Therapy:  Heparin level 0.3-0.7 units/ml aPTT 66-102 seconds Monitor platelets by anticoagulation protocol: Yes   Plan:   Heparin drip to begin at 4pm at 1600 units/hr (~14 units/kg adjusted body weight/hr)  Heparin level and aPTT ~ 6 hrs after heparin begins.  Heparin level and aPTT in am.  Follow up resuming Eliquis on 8/14.  Dennie Fettersgan, Eligah Anello Donovan, [place] Pager: [phone] 11/30/2016,1:14 PM

## 2016-12-01 ENCOUNTER — Telehealth: Payer: Self-pay | Admitting: Vascular Surgery

## 2016-12-01 DIAGNOSIS — I87002 Postthrombotic syndrome without complications of left lower extremity: Secondary | ICD-10-CM | POA: Diagnosis not present

## 2016-12-01 LAB — BASIC METABOLIC PANEL
ANION GAP: 6 (ref 5–15)
BUN: 13 mg/dL (ref 6–20)
CALCIUM: 8.4 mg/dL — AB (ref 8.9–10.3)
CO2: 25 mmol/L (ref 22–32)
Chloride: 108 mmol/L (ref 101–111)
Creatinine, Ser: 1.11 mg/dL (ref 0.61–1.24)
Glucose, Bld: 98 mg/dL (ref 65–99)
Potassium: 4.1 mmol/L (ref 3.5–5.1)
SODIUM: 139 mmol/L (ref 135–145)

## 2016-12-01 LAB — CBC
HEMATOCRIT: 36.8 % — AB (ref 39.0–52.0)
HEMOGLOBIN: 11.8 g/dL — AB (ref 13.0–17.0)
MCH: 28.6 pg (ref 26.0–34.0)
MCHC: 32.1 g/dL (ref 30.0–36.0)
MCV: 89.1 fL (ref 78.0–100.0)
Platelets: 215 10*3/uL (ref 150–400)
RBC: 4.13 MIL/uL — ABNORMAL LOW (ref 4.22–5.81)
RDW: 14.4 % (ref 11.5–15.5)
WBC: 5.9 10*3/uL (ref 4.0–10.5)

## 2016-12-01 LAB — APTT: APTT: 74 s — AB (ref 24–36)

## 2016-12-01 LAB — HEPARIN LEVEL (UNFRACTIONATED): HEPARIN UNFRACTIONATED: 0.42 [IU]/mL (ref 0.30–0.70)

## 2016-12-01 MED ORDER — APIXABAN 5 MG PO TABS
5.0000 mg | ORAL_TABLET | Freq: Two times a day (BID) | ORAL | Status: DC
  Administered 2016-12-01: 5 mg via ORAL
  Filled 2016-12-01: qty 1

## 2016-12-01 MED ORDER — METHOCARBAMOL 500 MG PO TABS
500.0000 mg | ORAL_TABLET | Freq: Four times a day (QID) | ORAL | Status: DC | PRN
Start: 2016-12-01 — End: 2021-01-14

## 2016-12-01 MED ORDER — ASPIRIN EC 81 MG PO TBEC: 81.0000 mg | DELAYED_RELEASE_TABLET | Freq: Every day | ORAL | Status: DC

## 2016-12-01 MED ORDER — ASPIRIN 325 MG PO TBEC: 325.0000 mg | DELAYED_RELEASE_TABLET | Freq: Two times a day (BID) | ORAL | 0 refills | Status: DC

## 2016-12-01 NOTE — Telephone Encounter (Signed)
LVM for appts 9/14 US n OV  mailed lttr to home address

## 2016-12-01 NOTE — Progress Notes (Signed)
  Progress Note    12/01/2016 7:42 AM 1 Day Post-Op  Subjective:  No overnight issues, urinating well  Vitals:   11/30/16 2051 12/01/16 0643  BP:  120/69  Pulse: 70   Resp: 18 18  Temp: 98.5 F (36.9 C) 98.2 F (36.8 C)  SpO2: 96%     Physical Exam: aaox3 Non labored respirations Left leg remains edematous  CBC    Component Value Date/Time   WBC 5.9 12/01/2016 0155   RBC 4.13 (L) 12/01/2016 0155   HGB 11.8 (L) 12/01/2016 0155   HCT 36.8 (L) 12/01/2016 0155   PLT 215 12/01/2016 0155   MCV 89.1 12/01/2016 0155   MCH 28.6 12/01/2016 0155   MCHC 32.1 12/01/2016 0155   RDW 14.4 12/01/2016 0155   LYMPHSABS 2.1 03/29/2015 1440   MONOABS 0.4 03/29/2015 1440   EOSABS 0.2 03/29/2015 1440   BASOSABS 0.0 03/29/2015 1440    BMET    Component Value Date/Time   NA 139 12/01/2016 0155   K 4.1 12/01/2016 0155   CL 108 12/01/2016 0155   CO2 25 12/01/2016 0155   GLUCOSE 98 12/01/2016 0155   BUN 13 12/01/2016 0155   CREATININE 1.11 12/01/2016 0155   CALCIUM 8.4 (L) 12/01/2016 0155   GFRNONAA >60 12/01/2016 0155   GFRAA >60 12/01/2016 0155    INR    Component Value Date/Time   INR 1.07 03/29/2015 1440     Intake/Output Summary (Last 24 hours) at 12/01/16 0742 Last data filed at 12/01/16 0644  Gross per 24 hour  Intake          2826.52 ml  Output             1775 ml  Net          1051.52 ml     Assessment:  61 y.o. male is s/p venous pharmacomechanical thrombectomy and balloon angioplasty of left femoral-popliteal veins  Plan: F/u in 4-6 weeks with lle venous duplex. Will re-calculate villalta score at that time (preop 7) Restart eliquis today Will take 81mg  asa as outpatient Continue compression stockings   Karmyn Lowman C. Randie Heinzain, MD [facility] Office: [phone] Pager: [phone]  12/01/2016 7:42 AM

## 2016-12-01 NOTE — Telephone Encounter (Signed)
-----   Message from Sharee PimpleMarilyn K McChesney, RN sent at 12/01/2016  8:47 AM EDT ----- Regarding: 4 weeks w/ LLE venous duplex postop    ----- Message ----- From: Raymond Gurneyrinh, Kimberly A, PA-C Sent: 12/01/2016   7:37 AM To: [facility] Charge Pool  S/p lysis LLE DVT 11/30/16  F/u with Dr. Randie Heinzain in 4 weeks with LLE venous duplex  Thanks Selena BattenKim

## 2016-12-03 NOTE — Discharge Summary (Signed)
[facility] Discharge Summary  Lorn Juneshomas L Giannelli [birth year] 61 y.o. 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 He was diagnosed with a significant left lower extremity DVT in June of this year and has been maintained on liquids since. Unfortunately he has complications of significant left lower extremity swelling despite compression stockings. He continues to walk but this does cause him pain. He also has mild pain and tenderness to touching the leg the leg also feels heavy and he has some skin changes as well. He does not have a personal or family history of DVT prior to this. He does have a history of having a colonoscopy he thinks 8 years ago which was negative and he is not due for another 2 years. He has not had any constitutional symptoms or weight loss and in fact has continued to gain approximately 4 pounds per year. Other than his left lower extremity he continues to be his normal level of activity and is walking more now that he has been diagnosed DVT. He continues to drive a truck.  Hospital Course:  The patient was admitted to the hospital and taken to the operating room on 11/30/2016 and underwent: 1.  US guided cannulation of left lesser saphenous and popliteal veins 2.  Left lower extremity ascending venography 3.  Central venogram 4.  IVUS of left popliteal, femoral, common femoral, external iliac, common iliac veins and ivc 5.  Angioplasty of left popliteal and femoral veins with 10mm balloon 6.  Moderate sedation with fentanyl and versed for 74 minutes 7.  pharmacomechanical thrombolysis of left femoral and popliteal veins with angiojet zelante for 163 seconds  Findings: Popliteal and femoral veins were occluded in the mid distal thigh area. He has  normal cephalad femoral vein, femoral vein and central veins. Following intervention he now has a channel angiographically through his popliteal and femoral veins.  The patient tolerated the procedure well and was transported to the PACU in stable condition.   POD 1: He was doing well, no issues urinating. Left leg was still edematous. Eliquis was restarted. He was advised to continue compression stockings. He was discharged home on POD 1 in good condition. He will follow-up in 4-6 weeks with left lower extremity venous duplex.    CBC    Component Value Date/Time   WBC 5.9 12/01/2016 0155   RBC 4.13 (L) 12/01/2016 0155   HGB 11.8 (L) 12/01/2016 0155   HCT 36.8 (L) 12/01/2016 0155   PLT 215 12/01/2016 0155   MCV 89.1 12/01/2016 0155   MCH 28.6 12/01/2016 0155   MCHC 32.1 12/01/2016 0155   RDW 14.4 12/01/2016 0155   LYMPHSABS 2.1 03/29/2015 1440   MONOABS 0.4 03/29/2015 1440   EOSABS 0.2 03/29/2015 1440   BASOSABS 0.0 03/29/2015 1440    BMET    Component Value Date/Time   NA 139 12/01/2016 0155   K 4.1 12/01/2016 0155   CL 108 12/01/2016 0155   CO2 25 12/01/2016 0155   GLUCOSE 98 12/01/2016 0155   BUN 13 12/01/2016 0155   CREATININE 1.11 12/01/2016 0155   CALCIUM 8.4 (L) 12/01/2016 0155   GFRNONAA >60 12/01/2016 0155   GFRAA >60 12/01/2016 0155     Discharge Instructions:   The patient is discharged to home with  extensive instructions on wound care and progressive ambulation.  They are instructed not to drive or perform any heavy lifting until returning to see the physician in his office.  Discharge Instructions    Activity as tolerated - No restrictions    Complete by:  As directed    Wear your compression stockings daily when you get up. Take off when you go to bed.   Call MD for:  redness, tenderness, or signs of infection (pain, swelling, bleeding, redness, odor or green/yellow discharge around incision site)    Complete by:  As directed    Call MD for:  severe or  increased pain, loss or decreased feeling  in affected limb(s)    Complete by:  As directed    Call MD for:  temperature >100.5    Complete by:  As directed    Driving Restrictions    Complete by:  As directed    No driving if having any leg pain and while taking any pain medication   Resume previous diet    Complete by:  As directed       Discharge Diagnosis:  pvd  Secondary Diagnosis: Patient Active Problem List   Diagnosis Date Noted  . DVT (deep venous thrombosis) (HCC) 11/30/2016  . PAD (peripheral artery disease) (HCC) 11/16/2016  . Primary osteoarthritis of right hip 04/09/2015  . Obesity 04/09/2015   Past Medical History:  Diagnosis Date  . Arthritis   . DVT (deep venous thrombosis) (HCC)   . GERD (gastroesophageal reflux disease)    ulcer  . Hypertension   . Serum lipids high   . Sleep apnea      Allergies as of 12/01/2016   No Known Allergies     Medication List    TAKE these medications   aspirin EC 81 MG tablet Take 1 tablet (81 mg total) by mouth daily. What changed:  medication strength  how much to take  when to take this   ELIQUIS 5 MG Tabs tablet Generic drug:  apixaban Take 5 mg by mouth 2 (two) times daily.   Fish Oil 1200 MG Caps Take 2,400 mg by mouth daily.   furosemide 20 MG tablet Commonly known as:  LASIX Take 20 mg by mouth daily.   lisinopril 10 MG tablet Commonly known as:  PRINIVIL,ZESTRIL Take 10 mg by mouth daily.   methocarbamol 500 MG tablet Commonly known as:  ROBAXIN Take 1 tablet (500 mg total) by mouth every 6 (six) hours as needed for muscle spasms.   multivitamin with minerals Tabs tablet Take 1 tablet by mouth daily. Centrum Silver 50 plus   omeprazole 20 MG tablet Commonly known as:  PRILOSEC OTC Take 20 mg by mouth daily before breakfast.   simvastatin 20 MG tablet Commonly known as:  ZOCOR Take 20 mg by mouth at bedtime.       Disposition: home  Patient's condition: is Good  Follow  up: 1. Dr. Randie Heinz in 4-6 weeks   Maris Berger, PA-C [facility] [phone] 12/03/2016  1:32 PM

## 2016-12-17 ENCOUNTER — Other Ambulatory Visit: Payer: Self-pay

## 2016-12-17 DIAGNOSIS — I739 Peripheral vascular disease, unspecified: Secondary | ICD-10-CM

## 2016-12-17 DIAGNOSIS — I824Y2 Acute embolism and thrombosis of unspecified deep veins of left proximal lower extremity: Secondary | ICD-10-CM

## 2017-01-01 ENCOUNTER — Ambulatory Visit (INDEPENDENT_AMBULATORY_CARE_PROVIDER_SITE_OTHER): Payer: BLUE CROSS/BLUE SHIELD | Admitting: Vascular Surgery

## 2017-01-01 ENCOUNTER — Encounter: Payer: Self-pay | Admitting: Vascular Surgery

## 2017-01-01 ENCOUNTER — Ambulatory Visit (HOSPITAL_COMMUNITY)
Admit: 2017-01-01 | Discharge: 2017-01-01 | Disposition: A | Discharge status: Home / Self Care | Payer: BLUE CROSS/BLUE SHIELD | Attending: Vascular Surgery | Admitting: Vascular Surgery

## 2017-01-01 VITALS — BP 126/84 | HR 72 | Temp 97.9°F | Resp 16 | Ht 76.0 in | Wt 328.0 lb

## 2017-01-01 DIAGNOSIS — I87002 Postthrombotic syndrome without complications of left lower extremity: Secondary | ICD-10-CM

## 2017-01-01 DIAGNOSIS — I739 Peripheral vascular disease, unspecified: Secondary | ICD-10-CM | POA: Diagnosis not present

## 2017-01-01 DIAGNOSIS — I824Y2 Acute embolism and thrombosis of unspecified deep veins of left proximal lower extremity: Secondary | ICD-10-CM | POA: Diagnosis not present

## 2017-01-01 NOTE — Progress Notes (Signed)
Patient ID: Joseph Bruce, male   DOB: 04/21/1955, 61 y.o.   MRN: [id number]  Reason for Consult: Routine Post Op   Referred by Noni Saupe, MD  Subjective:     HPI:  Joseph Bruce is a 61 y.o. 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 Since that time he says the swelling is much better. He continues on his anticoagulation and is wearing compression stockings religiously. States that he is walking with very little limited patient at this time overall is very satisfied with his result.  Past Medical History:  Diagnosis Date  . Arthritis   . DVT (deep venous thrombosis) (HCC)   . GERD (gastroesophageal reflux disease)    ulcer  . Hypertension   . Serum lipids high   . Sleep apnea    No family history on file. Past Surgical History:  Procedure Laterality Date  . COLONOSCOPY    . LOWER EXTREMITY VENOGRAPHY Left 11/30/2016   Procedure: Lower Extremity Venography;  Surgeon: Maeola Harman, MD;  Location: [facility] INVASIVE CV LAB;  Service: Cardiovascular;  Laterality: Left;  . PERIPHERAL VASCULAR BALLOON ANGIOPLASTY Left 11/30/2016   Procedure: PERIPHERAL VASCULAR BALLOON ANGIOPLASTY;  Surgeon: Maeola Harman, MD;  Location: [facility] INVASIVE CV LAB;  Service: Cardiovascular;  Laterality: Left;  . TOTAL HIP ARTHROPLASTY Right 04/09/2015   Procedure: TOTAL HIP ARTHROPLASTY ANTERIOR APPROACH;  Surgeon: Marcene Corning, MD;  Location: [facility] OR;  Service: Orthopedics;  Laterality: Right;  . WISDOM TOOTH EXTRACTION      Short Social History:  Social History  Substance Use Topics  . Smoking status: Never Smoker  . Smokeless tobacco: Former Neurosurgeon    Types: Chew  . Alcohol use Yes     Comment: occ    No Known Allergies  Current Outpatient Prescriptions  Medication Sig  Dispense Refill  . aspirin EC 81 MG tablet Take 1 tablet (81 mg total) by mouth daily.    Marland Kitchen ELIQUIS 5 MG TABS tablet Take 5 mg by mouth 2 (two) times daily.    Marland Kitchen lisinopril (PRINIVIL,ZESTRIL) 10 MG tablet Take 10 mg by mouth daily.  1  . Multiple Vitamin (MULTIVITAMIN WITH MINERALS) TABS tablet Take 1 tablet by mouth daily. Centrum Silver 50 plus    . Omega-3 Fatty Acids (FISH OIL) 1200 MG CAPS Take 2,400 mg by mouth daily.    Marland Kitchen omeprazole (PRILOSEC OTC) 20 MG tablet Take 20 mg by mouth daily before breakfast.    . simvastatin (ZOCOR) 20 MG tablet Take 20 mg by mouth at bedtime.  0  . furosemide (LASIX) 20 MG tablet Take 20 mg by mouth daily.     . methocarbamol (ROBAXIN) 500 MG tablet Take 1 tablet (500 mg total) by mouth every 6 (six) hours as needed for muscle spasms. (Patient not taking: Reported on 01/01/2017)     No current facility-administered medications for this visit.     Review of Systems  Constitutional:  Constitutional negative. HENT: HENT negative.  Eyes: Eyes negative.  Respiratory: Respiratory negative.  Cardiovascular: Positive for leg swelling.  GI: Gastrointestinal negative.  Musculoskeletal: Musculoskeletal negative.  Skin: Skin negative.  Neurological: Neurological negative. Hematologic: Hematologic/lymphatic negative.  Psychiatric: Psychiatric negative.        Objective:  Objective   Vitals:   01/01/17 0923  BP: 126/84  Pulse:  72  Resp: 16  Temp: 97.9 F (36.6 C)  SpO2: 97%  Weight: (!) 328 lb (148.8 kg)  Height:  (1.93 m)   Body mass index is 39.93 kg/m.  Physical Exam  Constitutional: He is oriented to person, place, and time. He appears well-developed.  HENT:  Head: Normocephalic.  Eyes: Pupils are equal, round, and reactive to light.  Neck: Normal range of motion.  Cardiovascular: Normal rate.   Pulses:      Dorsalis pedis pulses are 2+ on the right side, and 2+ on the left side.       Posterior tibial pulses are 2+ on the right  side, and 2+ on the left side.  Pulmonary/Chest: Effort normal.  Abdominal: Soft.  Musculoskeletal: Normal range of motion. He exhibits edema.  Right leg 47cm Left 51cm (measured 10cm distal to tibial tuberocity)  Neurological: He is alert and oriented to person, place, and time.  Skin: Skin is warm and dry.  Psychiatric: He has a normal mood and affect. His behavior is normal. Judgment and thought content normal.    Data: I have independently interpreted his left lower extremity duplex which demonstrates occlusion of his distal femoral and popliteal veins.  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 At last visit his wall score was 7 and is now down to 5 and he is satisfied with his results. I discussed with him that having an occluded above-knee popliteal vein is a very troublesome area for lower leg swelling. We will continue him on his blood thinner as well as compression stockings which he does wear religiously. I've also encourage walking as tolerated. He will follow up in 1 year with reflux study. He will call should he have issues before then.     Maeola Harman MD [facility]

## 2018-05-04 ENCOUNTER — Other Ambulatory Visit: Payer: Self-pay

## 2018-05-04 DIAGNOSIS — I87002 Postthrombotic syndrome without complications of left lower extremity: Secondary | ICD-10-CM

## 2018-05-04 DIAGNOSIS — I824Y2 Acute embolism and thrombosis of unspecified deep veins of left proximal lower extremity: Secondary | ICD-10-CM

## 2018-06-10 ENCOUNTER — Ambulatory Visit: Payer: BLUE CROSS/BLUE SHIELD | Admitting: Vascular Surgery

## 2018-06-10 ENCOUNTER — Ambulatory Visit (HOSPITAL_COMMUNITY)
Admission: RE | Admit: 2018-06-10 | Discharge: 2018-06-10 | Disposition: A | Discharge status: Home / Self Care | Payer: BLUE CROSS/BLUE SHIELD | Source: Ambulatory Visit | Attending: Vascular Surgery | Admitting: Vascular Surgery

## 2018-06-10 VITALS — BP 138/89 | HR 74 | Temp 97.8°F | Ht 76.0 in | Wt 339.5 lb

## 2018-06-10 DIAGNOSIS — I824Y2 Acute embolism and thrombosis of unspecified deep veins of left proximal lower extremity: Secondary | ICD-10-CM | POA: Diagnosis not present

## 2018-06-10 DIAGNOSIS — I87002 Postthrombotic syndrome without complications of left lower extremity: Secondary | ICD-10-CM

## 2018-06-10 NOTE — Progress Notes (Signed)
Patient ID: Joseph Bruce, male   DOB: Nov 17, 1955, 63 y.o.   MRN: [id number]  Reason for Consult: Follow-up   Referred by Noni Saupe, MD  Subjective:     HPI:  Joseph Bruce is a 63 y.o. male with a history of left lower extremity DVT underwent pharmacomechanical thrombolysis and thrombectomy with balloon angioplasty was popliteal and femoral veins.  He has remained on Eliquis and aspirin without complication.  At this point he is having very minimal swelling no discomfort.  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  He is able to walk without limitation.  He has not had any wounds.  Past Medical History:  Diagnosis Date  . Arthritis   . DVT (deep venous thrombosis) (HCC)   . GERD (gastroesophageal reflux disease)    ulcer  . Hypertension   . Serum lipids high   . Sleep apnea    No family history on file. Past Surgical History:  Procedure Laterality Date  . COLONOSCOPY    . LOWER EXTREMITY VENOGRAPHY Left 11/30/2016   Procedure: Lower Extremity Venography;  Surgeon: Maeola Harman, MD;  Location: [facility] INVASIVE CV LAB;  Service: Cardiovascular;  Laterality: Left;  . PERIPHERAL VASCULAR BALLOON ANGIOPLASTY Left 11/30/2016   Procedure: PERIPHERAL VASCULAR BALLOON ANGIOPLASTY;  Surgeon: Maeola Harman, MD;  Location: [facility] INVASIVE CV LAB;  Service: Cardiovascular;  Laterality: Left;  . TOTAL HIP ARTHROPLASTY Right 04/09/2015   Procedure: TOTAL HIP ARTHROPLASTY ANTERIOR APPROACH;  Surgeon: Marcene Corning, MD;  Location: [facility] OR;  Service: Orthopedics;  Laterality: Right;  . WISDOM TOOTH EXTRACTION      Short Social History:  Social History   Tobacco Use  . Smoking status: Never Smoker  . Smokeless tobacco: Former Neurosurgeon    Types: Chew  Substance Use Topics  . Alcohol use: Yes    Comment: occ    No Known Allergies  Current Outpatient Medications  Medication Sig Dispense Refill  . aspirin EC 81 MG tablet  Take 1 tablet (81 mg total) by mouth daily.    Marland Kitchen ELIQUIS 5 MG TABS tablet Take 5 mg by mouth 2 (two) times daily.    Marland Kitchen lisinopril (PRINIVIL,ZESTRIL) 10 MG tablet Take 10 mg by mouth daily.  1  . Multiple Vitamin (MULTIVITAMIN WITH MINERALS) TABS tablet Take 1 tablet by mouth daily. Centrum Silver 50 plus    . Omega-3 Fatty Acids (FISH OIL) 1200 MG CAPS Take 2,400 mg by mouth daily.    Marland Kitchen omeprazole (PRILOSEC OTC) 20 MG tablet Take 20 mg by mouth daily before breakfast.    . simvastatin (ZOCOR) 20 MG tablet Take 20 mg by mouth at bedtime.  0  . furosemide (LASIX) 20 MG tablet Take 20 mg by mouth daily.     . methocarbamol (ROBAXIN) 500 MG tablet Take 1 tablet (500 mg total) by mouth every 6 (six) hours as needed for muscle spasms. (Patient not taking: Reported on 01/01/2017)     No current facility-administered medications for this visit.     Review of Systems  Constitutional:  Constitutional negative. HENT: HENT negative.  Eyes: Eyes negative.  Respiratory: Respiratory negative.  Cardiovascular: Positive for leg swelling.  GI: Gastrointestinal negative.  Musculoskeletal: Positive for leg pain.  Skin: Skin negative.  Neurological: Neurological negative. Hematologic: Hematologic/lymphatic negative.  Psychiatric: Psychiatric negative.        Objective:  Objective   Vitals:   06/10/18 1547  BP:  138/89  Pulse: 74  Temp: 97.8 F (36.6 C)  TempSrc: Oral  SpO2: 96%  Weight: (!) 339 lb 8.1 oz (154 kg)  Height: 6\' 4"  (1.93 m)   Body mass index is 41.33 kg/m.  Physical Exam Constitutional:      Appearance: Normal appearance.  HENT:     Head: Normocephalic.  Eyes:     Pupils: Pupils are equal, round, and reactive to light.  Cardiovascular:     Rate and Rhythm: Normal rate.     Pulses:          Radial pulses are 2+ on the right side and 2+ on the left side.       Dorsalis pedis pulses are 2+ on the right side and 2+ on the left side.  Pulmonary:     Effort: Pulmonary effort  is normal.  Abdominal:     Palpations: Abdomen is soft.  Musculoskeletal:     Comments: Right leg 45.5 cm 10 cm distal to the tibial tuberosity and left leg 46.5 cm diameter  Skin:    General: Skin is warm and dry.     Capillary Refill: Capillary refill takes less than 2 seconds.  Neurological:     General: No focal deficit present.     Mental Status: He is alert.  Psychiatric:        Mood and Affect: Mood normal.        Behavior: Behavior normal.        Thought Content: Thought content normal.        Judgment: Judgment normal.     Data: I have independently interpreted his lower extremity reflux 30 which does demonstrate reflux in his small saphenous vein throughout and greater saphenous vein at the knee only on the left side.     Assessment/Plan:     63 year old male status post pharmacomechanical thrombectomy of his left lower extremity for DVT.  He has done very well Eliquis and aspirin.  At this time is probably safe to stop Eliquis transition to lifelong aspirin.  He will continue to walk as tolerated and wear his compression stockings lifelong.  I discussed with him both the risks of taking Eliquis as well as the risk of coming off of Eliquis and he understands this.  He is set to retire within the next year which have congratulated him on.  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  He will call me if he has any issues with swelling of his left lower extremity     Maeola Harman MD [facility]

## 2021-01-08 ENCOUNTER — Ambulatory Visit: Payer: BC Managed Care – PPO | Admitting: Cardiology

## 2021-01-14 DIAGNOSIS — E785 Hyperlipidemia, unspecified: Secondary | ICD-10-CM | POA: Insufficient documentation

## 2021-01-14 DIAGNOSIS — I2699 Other pulmonary embolism without acute cor pulmonale: Secondary | ICD-10-CM | POA: Insufficient documentation

## 2021-01-14 DIAGNOSIS — M7750 Other enthesopathy of unspecified foot: Secondary | ICD-10-CM

## 2021-01-14 DIAGNOSIS — K219 Gastro-esophageal reflux disease without esophagitis: Secondary | ICD-10-CM | POA: Insufficient documentation

## 2021-01-14 DIAGNOSIS — G4730 Sleep apnea, unspecified: Secondary | ICD-10-CM | POA: Insufficient documentation

## 2021-01-14 DIAGNOSIS — K2970 Gastritis, unspecified, without bleeding: Secondary | ICD-10-CM

## 2021-01-14 DIAGNOSIS — I10 Essential (primary) hypertension: Secondary | ICD-10-CM | POA: Insufficient documentation

## 2021-01-14 DIAGNOSIS — E7800 Pure hypercholesterolemia, unspecified: Secondary | ICD-10-CM | POA: Insufficient documentation

## 2021-01-14 HISTORY — DX: Gastritis, unspecified, without bleeding: K29.70

## 2021-01-14 HISTORY — DX: Other enthesopathy of unspecified foot and ankle: M77.50

## 2021-01-14 HISTORY — DX: Gastro-esophageal reflux disease without esophagitis: K21.9

## 2021-02-18 ENCOUNTER — Other Ambulatory Visit: Payer: Self-pay

## 2021-02-18 ENCOUNTER — Ambulatory Visit (INDEPENDENT_AMBULATORY_CARE_PROVIDER_SITE_OTHER): Payer: BC Managed Care – PPO | Admitting: Cardiology

## 2021-02-18 ENCOUNTER — Encounter: Payer: Self-pay | Admitting: Cardiology

## 2021-02-18 VITALS — BP 142/90 | HR 73 | Ht 76.0 in | Wt 341.8 lb

## 2021-02-18 DIAGNOSIS — K219 Gastro-esophageal reflux disease without esophagitis: Secondary | ICD-10-CM

## 2021-02-18 DIAGNOSIS — R0600 Dyspnea, unspecified: Secondary | ICD-10-CM

## 2021-02-18 DIAGNOSIS — Z86711 Personal history of pulmonary embolism: Secondary | ICD-10-CM | POA: Diagnosis not present

## 2021-02-18 DIAGNOSIS — M7989 Other specified soft tissue disorders: Secondary | ICD-10-CM | POA: Diagnosis not present

## 2021-02-18 DIAGNOSIS — R0609 Other forms of dyspnea: Secondary | ICD-10-CM

## 2021-02-18 DIAGNOSIS — Z86718 Personal history of other venous thrombosis and embolism: Secondary | ICD-10-CM

## 2021-02-18 DIAGNOSIS — E7800 Pure hypercholesterolemia, unspecified: Secondary | ICD-10-CM

## 2021-02-18 NOTE — Patient Instructions (Signed)
Medication Instructions:  Your physician recommends that you continue on your current medications as directed. Please refer to the Current Medication list given to you today.  *If you need a refill on your cardiac medications before your next appointment, please call your pharmacy*   Lab Work: None If you have labs (blood work) drawn today and your tests are completely normal, you will receive your results only by: MyChart Message (if you have MyChart) OR A paper copy in the mail If you have any lab test that is abnormal or we need to change your treatment, we will call you to review the results.   Testing/Procedures: Your physician has requested that you have an echocardiogram. Echocardiography is a painless test that uses sound waves to create images of your heart. It provides your doctor with information about the size and shape of your heart and how well your heart's chambers and valves are working. This procedure takes approximately one hour. There are no restrictions for this procedure.  Your physician has requested that you have a lower or upper extremity venous duplex. This test is an ultrasound of the veins in the legs or arms. It looks at venous blood flow that carries blood from the heart to the legs or arms. Allow one hour for a Lower Venous exam. Allow thirty minutes for an Upper Venous exam. There are no restrictions or special instructions.      [facility] [address] [place], [place] [address] Phone:  [phone]    Please arrive 15 minutes prior to your appointment time for registration and insurance purposes.  The test will take approximately 3 to 4 hours to complete; you may bring reading material.  If someone comes with you to your appointment, they will need to remain in the main lobby due to limited space in the testing area. **If you are pregnant or breastfeeding, please notify the nuclear lab prior to your appointment**  How to  prepare for your Myocardial Perfusion Test: Do not eat or drink 3 hours prior to your test, except you may have water. Do not consume products containing caffeine (regular or decaffeinated) 12 hours prior to your test. (ex: coffee, chocolate, sodas, tea). Do bring a list of your current medications with you.  If not listed below, you may take your medications as normal.  Do wear comfortable clothes (no dresses or overalls) and walking shoes, tennis shoes preferred (No heels or open toe shoes are allowed). Do NOT wear cologne, perfume, aftershave, or lotions (deodorant is allowed). If these instructions are not followed, your test will have to be rescheduled.  Please report to [address] for your test.  If you have questions or concerns about your appointment, you can call the [facility] at [phone].  If you cannot keep your appointment, please provide 24 hours notification to the Nuclear Lab, to avoid a possible $50 charge to your account.    Follow-Up: At [facility], you and your health needs are our priority.  As part of our continuing mission to provide you with exceptional heart care, we have created designated Provider Care Teams.  These Care Teams include your primary Cardiologist (physician) and Advanced Practice Providers (APPs -  Physician Assistants and Nurse Practitioners) who all work together to provide you with the care you need, when you need it.  We recommend signing up for the patient portal called "MyChart".  Sign up information is provided on this After  Visit Summary.  MyChart is used to connect with patients for Virtual Visits (Telemedicine).  Patients are able to view lab/test results, encounter notes, upcoming appointments, etc.  Non-urgent messages can be sent to your provider as well.   To learn more about what you can do with MyChart, go to [email or web address].    Your next appointment:   2 month(s)  The format  for your next appointment:   In Person  Provider:   Gypsy Balsam, MD   Other Instructions  Echocardiogram An echocardiogram is a test that uses sound waves (ultrasound) to produce images of the heart. Images from an echocardiogram can provide important information about: Heart size and shape. The size and thickness and movement of your heart's walls. Heart muscle function and strength. Heart valve function or if you have stenosis. Stenosis is when the heart valves are too narrow. If blood is flowing backward through the heart valves (regurgitation). A tumor or infectious growth around the heart valves. Areas of heart muscle that are not working well because of poor blood flow or injury from a heart attack. Aneurysm detection. An aneurysm is a weak or damaged part of an artery wall. The wall bulges out from the normal force of blood pumping through the body. Tell a health care provider about: Any allergies you have. All medicines you are taking, including vitamins, herbs, eye drops, creams, and over-the-counter medicines. Any blood disorders you have. Any surgeries you have had. Any medical conditions you have. Whether you are pregnant or may be pregnant. What are the risks? Generally, this is a safe test. However, problems may occur, including an allergic reaction to dye (contrast) that may be used during the test. What happens before the test? No specific preparation is needed. You may eat and drink normally. What happens during the test?  You will take off your clothes from the waist up and put on a hospital gown. Electrodes or electrocardiogram (ECG)patches may be placed on your chest. The electrodes or patches are then connected to a device that monitors your heart rate and rhythm. You will lie down on a table for an ultrasound exam. A gel will be applied to your chest to help sound waves pass through your skin. A handheld device, called a transducer, will be pressed  against your chest and moved over your heart. The transducer produces sound waves that travel to your heart and bounce back (or "echo" back) to the transducer. These sound waves will be captured in real-time and changed into images of your heart that can be viewed on a video monitor. The images will be recorded on a computer and reviewed by your health care provider. You may be asked to change positions or hold your breath for a short time. This makes it easier to get different views or better views of your heart. In some cases, you may receive contrast through an IV in one of your veins. This can improve the quality of the pictures from your heart. The procedure may vary among health care providers and hospitals. What can I expect after the test? You may return to your normal, everyday life, including diet, activities, and medicines, unless your health care provider tells you not to do that. Follow these instructions at home: It is up to you to get the results of your test. Ask your health care provider, or the department that is doing the test, when your results will be ready. Keep all follow-up visits. This is important. Summary An echocardiogram  is a test that uses sound waves (ultrasound) to produce images of the heart. Images from an echocardiogram can provide important information about the size and shape of your heart, heart muscle function, heart valve function, and other possible heart problems. You do not need to do anything to prepare before this test. You may eat and drink normally. After the echocardiogram is completed, you may return to your normal, everyday life, unless your health care provider tells you not to do that. This information is not intended to replace advice given to you by your health care provider. Make sure you discuss any questions you have with your health care provider. Document Revised: 11/28/2019 Document Reviewed: 11/28/2019 Elsevier Patient Education  2022  Elsevier Inc[employer]  Cardiac Nuclear Scan A cardiac nuclear scan is a test that measures blood flow to the heart when a person is resting and when he or she is exercising. The test looks for problems such as: Not enough blood reaching a portion of the heart. The heart muscle not working normally. You may need this test if: You have heart disease. You have had abnormal lab results. You have had heart surgery or a balloon procedure to open up blocked arteries (angioplasty). You have chest pain. You have shortness of breath. In this test, a radioactive dye (tracer) is injected into your bloodstream. After the tracer has traveled to your heart, an imaging device is used to measure how much of the tracer is absorbed by or distributed to various areas of your heart. This procedure is usually done at a hospital and takes 2-4 hours. Tell a health care provider about: Any allergies you have. All medicines you are taking, including vitamins, herbs, eye drops, creams, and over-the-counter medicines. Any problems you or family members have had with anesthetic medicines. Any blood disorders you have. Any surgeries you have had. Any medical conditions you have. Whether you are pregnant or may be pregnant. What are the risks? Generally, this is a safe procedure. However, problems may occur, including: Serious chest pain and heart attack. This is only a risk if the stress portion of the test is done. Rapid heartbeat. Sensation of warmth in your chest. This usually passes quickly. Allergic reaction to the tracer. What happens before the procedure? Ask your health care provider about changing or stopping your regular medicines. This is especially important if you are taking diabetes medicines or blood thinners. Follow instructions from your health care provider about eating or drinking restrictions. Remove your jewelry on the day of the procedure. What happens during the procedure? An IV will be inserted  into one of your veins. Your health care provider will inject a small amount of radioactive tracer through the IV. You will wait for 20-40 minutes while the tracer travels through your bloodstream. Your heart activity will be monitored with an electrocardiogram (ECG). You will lie down on an exam table. Images of your heart will be taken for about 15-20 minutes. You may also have a stress test. For this test, one of the following may be done: You will exercise on a treadmill or stationary bike. While you exercise, your heart's activity will be monitored with an ECG, and your blood pressure will be checked. You will be given medicines that will increase blood flow to parts of your heart. This is done if you are unable to exercise. When blood flow to your heart has peaked, a tracer will again be injected through the IV. After 20-40 minutes, you will get back  on the exam table and have more images taken of your heart. Depending on the type of tracer used, scans may need to be repeated 3-4 hours later. Your IV line will be removed when the procedure is over. The procedure may vary among health care providers and hospitals. What happens after the procedure? Unless your health care provider tells you otherwise, you may return to your normal schedule, including diet, activities, and medicines. Unless your health care provider tells you otherwise, you may increase your fluid intake. This will help to flush the contrast dye from your body. Drink enough fluid to keep your urine pale yellow. Ask your health care provider, or the department that is doing the test: When will my results be ready? How will I get my results? Summary A cardiac nuclear scan measures the blood flow to the heart when a person is resting and when he or she is exercising. Tell your health care provider if you are pregnant. Before the procedure, ask your health care provider about changing or stopping your regular medicines. This is  especially important if you are taking diabetes medicines or blood thinners. After the procedure, unless your health care provider tells you otherwise, increase your fluid intake. This will help flush the contrast dye from your body. After the procedure, unless your health care provider tells you otherwise, you may return to your normal schedule, including diet, activities, and medicines. This information is not intended to replace advice given to you by your health care provider. Make sure you discuss any questions you have with your health care provider. Document Revised: 09/20/2017 Document Reviewed: 09/20/2017 Elsevier Patient Education  2022 [employer].

## 2021-02-18 NOTE — Progress Notes (Signed)
Cardiology Consultation:    Date:  02/18/2021   ID:  Joseph Bruce, DOB 30-Dec-1955, MRN [id number]  PCP:  Noni Saupe, MD  Cardiologist:  Gypsy Balsam, MD   Referring MD: Noni Saupe, MD   Chief Complaint  Patient presents with   Shortness of Breath    History of Present Illness:    Joseph Bruce is a 65 y.o. male who is being seen today for the evaluation of dyspnea on exertion at the request of Noni Saupe, MD. with complex past medical history.  That include morbid obesity, obstructive sleep apnea, in 2018 he ended up having DVT acute with bilateral PE.  There was right ventricle strength noted on the CT at the time of presentation.  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 He was referred to [place] because of dyspnea on exertion.  He lives hunting and he was going to his stent couple days ago when going uphill brought severe shortness of breath to the point that he had to stop.  He denied describe having also some uneasy sensation in the chest.  After a minute or so standing he was able to continue his walk.  He denies having any dizziness or passing out.  He does not exercise on the regular basis he likes hunting always go to the stent with no difficulties however this time he went a different way which was much longer and much more strenuous. He does have history of DVT PE in 2019 There is no family history of premature coronary disease No smoking   Past Medical History:  Diagnosis Date   Arthritis    Bursitis of heel 01/14/2021   Degenerative arthritis    DVT (deep venous thrombosis) (HCC)    Gastritis    Gastritis 01/14/2021   GERD (gastroesophageal reflux disease)     ulcer   GERD (gastroesophageal reflux disease) 01/14/2021   ulcer   Hypercholesterolemia    Hypertension    Morbid obesity (HCC)    Obesity 04/09/2015   Primary osteoarthritis of right hip 04/09/2015   Pulmonary emboli (HCC)    Right hip pain 04/03/2013   Serum lipids high    Sleep apnea    Tendonitis of foot 06/06/2015   Tightness of left heel cord 06/06/2015    Past Surgical History:  Procedure Laterality Date   angioplasty of left popliteal and femoral veins      CATARACT EXTRACTION     COLONOSCOPY     LOWER EXTREMITY VENOGRAPHY Left 11/30/2016   Procedure: Lower Extremity Venography;  Surgeon: Maeola Harman, MD;  Location: [facility] INVASIVE CV LAB;  Service: Cardiovascular;  Laterality: Left;   PERIPHERAL VASCULAR BALLOON ANGIOPLASTY Left 11/30/2016   Procedure: PERIPHERAL VASCULAR BALLOON ANGIOPLASTY;  Surgeon: Maeola Harman, MD;  Location: [facility] INVASIVE CV LAB;  Service: Cardiovascular;  Laterality: Left;   TOTAL HIP ARTHROPLASTY Right 04/09/2015   Procedure: TOTAL HIP ARTHROPLASTY ANTERIOR APPROACH;  Surgeon: Marcene Corning, MD;  Location: [facility] OR;  Service: Orthopedics;  Laterality: Right;   WISDOM TOOTH EXTRACTION      Current Medications: Current Meds  Medication Sig   aspirin EC 81 MG tablet Take 1 tablet (81  mg total) by mouth daily.   lisinopril (PRINIVIL,ZESTRIL) 10 MG tablet Take 10 mg by mouth daily.   Multiple Vitamin (MULTIVITAMIN WITH MINERALS) TABS tablet Take 1 tablet by mouth daily. Unknown strenghtCentrum Silver 50 plus/Unknown strength   Omega-3 Fatty Acids (FISH OIL) 1200 MG CAPS Take 2,400 mg by mouth daily.   omeprazole (PRILOSEC OTC) 20 MG tablet Take 20 mg by mouth daily before breakfast.   scopolamine (TRANSDERM-SCOP) 1 MG/3DAYS Place 1 patch onto the skin as needed (See fish traveling/ motion sickness).   simvastatin (ZOCOR) 20 MG tablet Take 20 mg by mouth at bedtime.   [DISCONTINUED] ELIQUIS 5 MG TABS tablet Take 5 mg by mouth 2  (two) times daily.     Allergies:   Patient has no known allergies.   Social History   Socioeconomic History   Marital status: Married    Spouse name: Not on file   Number of children: Not on file   Years of education: Not on file   Highest education level: Not on file  Occupational History   Not on file  Tobacco Use   Smoking status: Never   Smokeless tobacco: Former    Types: Associate Professor Use: Never used  Substance and Sexual Activity   Alcohol use: Yes    Comment: occ   Drug use: No   Sexual activity: Yes  Other Topics Concern   Not on file  Social History Narrative   Not on file   Social Determinants of Health   Financial Resource Strain: Not on file  Food Insecurity: Not on file  Transportation Needs: Not on file  Physical Activity: Not on file  Stress: Not on file  Social Connections: Not on file     Family History: The patient's family history is not on file. ROS:   Please see the history of present illness.    All 14 point review of systems negative except as described per history of present illness.  EKGs/Labs/Other Studies Reviewed:    The following studies were reviewed today:  I did review echo from his primary care physician as well as record from around the hospital trying to figure out the etiology of his DVT and PE.  EKG:  EKG i today.  The ekg ordered today demonstrates normal sinus rhythm normal P interval normal QS complex duration fulgent no ST segment changes.  Recent Labs: No results found for requested labs within last 8760 hours.  Recent Lipid Panel No results found for: CHOL, TRIG, HDL, CHOLHDL, VLDL, LDLCALC, LDLDIRECT  Physical Exam:    VS:  BP (!) 142/90 (BP Location: Right Arm, Patient Position: Sitting)   Pulse 73   Ht 6\' 4"  (1.93 m)   Wt (!) 341 lb 12.8 oz (155 kg)   SpO2 96%   BMI 41.61 kg/m     Wt Readings from Last 3 Encounters:  02/18/21 (!) 341 lb 12.8 oz (155 kg)  06/10/18 (!) 339 lb 8.1 oz (154  kg)  01/01/17 (!) 328 lb (148.8 kg)     GEN:  Well nourished, well developed in no acute distress HEENT: Normal NECK: No JVD; No carotid bruits LYMPHATICS: No lymphadenopathy CARDIAC: RRR, no murmurs, no rubs, no gallops RESPIRATORY:  Clear to auscultation without rales, wheezing or rhonchi  ABDOMEN: Soft, non-tender, non-distended MUSCULOSKELETAL:  No edema; No deformity  SKIN: Warm and dry NEUROLOGIC:  Alert and oriented x 3 PSYCHIATRIC:  Normal affect   ASSESSMENT:    1.  Leg swelling   2. Dyspnea, unspecified type   3. Dyspnea on exertion   4. History of pulmonary embolism   5. History of DVT (deep vein thrombosis)   6. Hypercholesterolemia   7. Gastroesophageal reflux disease without esophagitis    PLAN:    In order of problems listed above:  Leg swelling which is asymmetrical.  Probably related to chronic renal insufficiency secondary to DVT that he had before.  Still I think will be reasonable to rule out DVT.  I will schedule him to have DVT study of lower extremity Dyspnea on exertion: Multifactorial, I will schedule him to have an echocardiogram to assess left ventricle ejection fraction, but looking also for right ventricle strength and size.  He also will be scheduled to have stress test to rule out ischemia as potential cause for his dyspnea on exertion.  However if all those things are negative I think we can blame on morbid obesity and lack of mobility. History of DVT and PE was quite serious event feeling was that it was related to him driving long distances and sitting for long period time that factor seems to be eliminated and hopefully he will never suffer from P again but we need to revisit this issue on the regular basis and reinitiate anticoagulation if suspicion for another factor that can promote PE will recur.   Medication Adjustments/Labs and Tests Ordered: Current medicines are reviewed at length with the patient today.  Concerns regarding medicines are  outlined above.  Orders Placed This Encounter  Procedures   MYOCARDIAL PERFUSION IMAGING   EKG 12-Lead   ECHOCARDIOGRAM COMPLETE   ECHOCARDIOGRAM COMPLETE   VAS [place] LOWER EXTREMITY VENOUS (DVT)   No orders of the defined types were placed in this encounter.   Signed, Georgeanna Lea, MD, [facility]. 02/18/2021 5:06 PM    [facility]

## 2021-02-19 ENCOUNTER — Telehealth (HOSPITAL_COMMUNITY): Payer: Self-pay | Admitting: *Deleted

## 2021-02-19 NOTE — Telephone Encounter (Signed)
Left message on voicemail per DPR in reference to upcoming appointment scheduled on 02/26/21 at 11:00 with detailed instructions given per Myocardial Perfusion Study Information Sheet for the test. LM to arrive 15 minutes early, and that it is imperative to arrive on time for appointment to keep from having the test rescheduled. If you need to cancel or reschedule your appointment, please call the office within 24 hours of your appointment. Failure to do so may result in a cancellation of your appointment, and a $50 no show fee. Phone number given for call back for any questions.

## 2021-02-26 ENCOUNTER — Ambulatory Visit (INDEPENDENT_AMBULATORY_CARE_PROVIDER_SITE_OTHER): Payer: BC Managed Care – PPO

## 2021-02-26 ENCOUNTER — Other Ambulatory Visit: Payer: Self-pay

## 2021-02-26 DIAGNOSIS — R0600 Dyspnea, unspecified: Secondary | ICD-10-CM | POA: Diagnosis not present

## 2021-02-26 DIAGNOSIS — M7989 Other specified soft tissue disorders: Secondary | ICD-10-CM

## 2021-02-26 MED ORDER — REGADENOSON 0.4 MG/5ML IV SOLN
0.4000 mg | Freq: Once | INTRAVENOUS | Status: AC
  Administered 2021-02-26: 0.4 mg via INTRAVENOUS

## 2021-02-26 MED ORDER — TECHNETIUM TC 99M TETROFOSMIN IV KIT
28.3000 | PACK | Freq: Once | INTRAVENOUS | Status: AC | PRN
  Administered 2021-02-26: 28.3 via INTRAVENOUS

## 2021-02-27 ENCOUNTER — Ambulatory Visit: Payer: BC Managed Care – PPO

## 2021-02-27 LAB — MYOCARDIAL PERFUSION IMAGING
LV dias vol: 133 mL (ref 62–150)
LV sys vol: 46 mL
Nuc Stress EF: 66 %
Peak HR: 92 {beats}/min
Rest HR: 67 {beats}/min
Rest Nuclear Isotope Dose: 27.8 mCi
SDS: 0
SRS: 5
SSS: 5
Stress Nuclear Isotope Dose: 28.3 mCi
TID: 0.92

## 2021-02-27 MED ORDER — TECHNETIUM TC 99M TETROFOSMIN IV KIT
27.8000 | PACK | Freq: Once | INTRAVENOUS | Status: AC | PRN
  Administered 2021-02-27: 27.8 via INTRAVENOUS

## 2021-03-03 ENCOUNTER — Other Ambulatory Visit: Payer: BC Managed Care – PPO

## 2021-03-03 ENCOUNTER — Ambulatory Visit (INDEPENDENT_AMBULATORY_CARE_PROVIDER_SITE_OTHER): Payer: BC Managed Care – PPO

## 2021-03-03 ENCOUNTER — Other Ambulatory Visit: Payer: Self-pay

## 2021-03-03 DIAGNOSIS — M7989 Other specified soft tissue disorders: Secondary | ICD-10-CM

## 2021-03-03 DIAGNOSIS — R0600 Dyspnea, unspecified: Secondary | ICD-10-CM

## 2021-03-03 LAB — ECHOCARDIOGRAM COMPLETE
Area-P 1/2: 4.96 cm2
S' Lateral: 3.2 cm

## 2021-03-05 ENCOUNTER — Telehealth: Payer: Self-pay | Admitting: Emergency Medicine

## 2021-03-05 DIAGNOSIS — I824Y2 Acute embolism and thrombosis of unspecified deep veins of left proximal lower extremity: Secondary | ICD-10-CM

## 2021-03-05 NOTE — Telephone Encounter (Signed)
Patient informed of results. He will come to office for labs. No further questions.

## 2021-03-05 NOTE — Telephone Encounter (Signed)
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  He need to be anticoagulated.  Please ask him to have CBC done PT/INR as well as stool for guaiac and then will initiate Eliquis 5 mg twice daily

## 2021-03-06 LAB — BASIC METABOLIC PANEL
BUN/Creatinine Ratio: 10 (ref 10–24)
BUN: 10 mg/dL (ref 8–27)
CO2: 22 mmol/L (ref 20–29)
Calcium: 9.2 mg/dL (ref 8.6–10.2)
Chloride: 105 mmol/L (ref 96–106)
Creatinine, Ser: 0.99 mg/dL (ref 0.76–1.27)
Glucose: 86 mg/dL (ref 70–99)
Potassium: 4.6 mmol/L (ref 3.5–5.2)
Sodium: 142 mmol/L (ref 134–144)
eGFR: 86 mL/min/{1.73_m2} (ref >59)

## 2021-03-06 LAB — CBC
Hematocrit: 41.7 % (ref 37.5–51.0)
Hemoglobin: 13.9 g/dL (ref 13.0–17.7)
MCH: 29.5 pg (ref 26.6–33.0)
MCHC: 33.3 g/dL (ref 31.5–35.7)
MCV: 89 fL (ref 79–97)
Platelets: 258 10*3/uL (ref 150–450)
RBC: 4.71 x10E6/uL (ref 4.14–5.80)
RDW: 12.8 % (ref 11.6–15.4)
WBC: 6.7 10*3/uL (ref 3.4–10.8)

## 2021-03-06 MED ORDER — APIXABAN 5 MG PO TABS: 5.0000 mg | ORAL_TABLET | Freq: Two times a day (BID) | ORAL | 3 refills | Status: DC

## 2021-03-06 NOTE — Addendum Note (Signed)
Addended by: Eleonore Chiquito on: 03/06/2021 04:11 PM   Modules accepted: Orders

## 2021-03-08 LAB — FECAL OCCULT BLOOD, IMMUNOCHEMICAL: Fecal Occult Bld: NEGATIVE

## 2021-03-10 ENCOUNTER — Telehealth: Payer: Self-pay

## 2021-03-10 NOTE — Telephone Encounter (Signed)
-----   Message from Georgeanna Lea, MD sent at 03/10/2021 10:07 AM EST ----- There is no blood in the stool all labs are looking good if he is not already on Eliquis 5 mg daily we need to start it.  Please make sure he does not take aspirin

## 2021-03-10 NOTE — Telephone Encounter (Signed)
Patient notified of results and recommendations. Patient informed not to be on ASA while on Eliquis,.

## 2021-04-23 DIAGNOSIS — M1990 Unspecified osteoarthritis, unspecified site: Secondary | ICD-10-CM | POA: Insufficient documentation

## 2021-04-24 ENCOUNTER — Other Ambulatory Visit: Payer: Self-pay

## 2021-04-24 ENCOUNTER — Encounter: Payer: Self-pay | Admitting: Cardiology

## 2021-04-24 ENCOUNTER — Ambulatory Visit: Payer: BC Managed Care – PPO | Admitting: Cardiology

## 2021-04-24 VITALS — BP 150/80 | HR 79 | Ht 76.0 in | Wt 346.4 lb

## 2021-04-24 DIAGNOSIS — Z23 Encounter for immunization: Secondary | ICD-10-CM

## 2021-04-24 DIAGNOSIS — E7800 Pure hypercholesterolemia, unspecified: Secondary | ICD-10-CM

## 2021-04-24 DIAGNOSIS — R0609 Other forms of dyspnea: Secondary | ICD-10-CM | POA: Diagnosis not present

## 2021-04-24 DIAGNOSIS — Z86711 Personal history of pulmonary embolism: Secondary | ICD-10-CM | POA: Diagnosis not present

## 2021-04-24 DIAGNOSIS — I10 Essential (primary) hypertension: Secondary | ICD-10-CM

## 2021-04-24 DIAGNOSIS — Z86718 Personal history of other venous thrombosis and embolism: Secondary | ICD-10-CM

## 2021-04-24 MED ORDER — LISINOPRIL 20 MG PO TABS: 20.0000 mg | ORAL_TABLET | Freq: Every day | ORAL | 1 refills | Status: DC

## 2021-04-24 NOTE — Progress Notes (Signed)
Cardiology Office Note:    Date:  04/24/2021   ID:  Joseph Bruce, DOB [birth year], MRN [id number]  PCP:  Noni Saupe, MD  Cardiologist:  Gypsy Balsam, MD    Referring MD: Noni Saupe, MD   Chief Complaint  Patient presents with   Follow-up  Doing fine  History of Present Illness:    Joseph Bruce is a 66 y.o. male with a past medical history significant for DVT with PE, essential hypertension, dyslipidemia.  He was referred to me because of shortness of breath.  He also got swelling of lower extremities.  Work-up included echocardiogram which showed normal left ventricle ejection fraction mild left ventricle hypertrophy, however diastolic parameters were normal.  He also have a stress test which showed no evidence of ischemia, because of history of DVT on the left side I did ultrasounds of his lower extremities which show chronic DVT.  We decided to anticoagulate him because truly DVT episode that he had before was unprovoked.  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 He comes today to my office doing very well.  Denies have any chest pain tightness squeezing pressure burning chest  Past Medical History:  Diagnosis Date   Arthritis    Bursitis of heel 01/14/2021   Degenerative arthritis    DVT (deep venous thrombosis) (HCC)    Gastritis    Gastritis 01/14/2021   GERD (gastroesophageal reflux disease)    ulcer   GERD (gastroesophageal reflux disease) 01/14/2021   ulcer   Hypercholesterolemia    Hypertension    Morbid obesity (HCC)    Obesity 04/09/2015   Primary osteoarthritis of right hip 04/09/2015   Pulmonary emboli (HCC)    Right hip pain 04/03/2013   Serum lipids high    Sleep apnea    Tendonitis of foot 06/06/2015   Tightness of left heel cord  06/06/2015    Past Surgical History:  Procedure Laterality Date   angioplasty of left popliteal and femoral veins      CATARACT EXTRACTION     COLONOSCOPY     LOWER EXTREMITY VENOGRAPHY Left 11/30/2016   Procedure: Lower Extremity Venography;  Surgeon: Maeola Harman, MD;  [facility] INVASIVE CV LAB;  Service: Cardiovascular;  Laterality: Left;   PERIPHERAL VASCULAR BALLOON ANGIOPLASTY Left 11/30/2016   Procedure: PERIPHERAL VASCULAR BALLOON ANGIOPLASTY;  Surgeon: Maeola Harman, MD;  Location[facility] INVASIVE CV LAB;  Service: Cardiovascular;  Laterality: Left;   TOTAL HIP ARTHROPLASTY Right 04/09/2015   Procedure: TOTAL HIP ARTHROPLASTY ANTERIOR APPROACH;  Surgeon: Marcene Corning, MD;  Location: [facility] OR;  Service: Orthopedics;  Laterality: Right;   WISDOM TOOTH EXTRACTION      Current Medications: Current Meds  Medication Sig   apixaban (ELIQUIS) 5 MG TABS tablet Take 1 tablet (5 mg total) by mouth 2 (two) times daily.   lisinopril (PRINIVIL,ZESTRIL) 10 MG tablet Take 10 mg by mouth daily.   Multiple Vitamin (MULTIVITAMIN WITH MINERALS) TABS tablet Take 1 tablet by mouth daily. Unknown strenghtCentrum Silver 50 plus/Unknown strength   Omega-3 Fatty Acids (FISH OIL) 1200 MG CAPS Take 2,400 mg by mouth daily.   omeprazole (PRILOSEC OTC) 20 MG tablet Take 20 mg by mouth daily before breakfast.   scopolamine (TRANSDERM-SCOP) 1 MG/3DAYS Place 1  patch onto the skin as needed (See fish traveling/ motion sickness).   simvastatin (ZOCOR) 20 MG tablet Take 20 mg by mouth at bedtime.     Allergies:   Patient has no known allergies.   Social History   Socioeconomic History   Marital status: Married    Spouse name: Not on file   Number of children: Not on file   Years of education: Not on file   Highest education level: Not on file  Occupational History   Not on file  Tobacco Use   Smoking status: Never   Smokeless tobacco: Former    Types: Mining engineerChew  Vaping Use    Vaping Use: Never used  Substance and Sexual Activity   Alcohol use: Yes    Comment: occ   Drug use: No   Sexual activity: Yes  Other Topics Concern   Not on file  Social History Narrative   Not on file   Social Determinants of Health   Financial Resource Strain: Not on file  Food Insecurity: Not on file  Transportation Needs: Not on file  Physical Activity: Not on file  Stress: Not on file  Social Connections: Not on file     Family History: The patient's family history is not on file. ROS:   Please see the history of present illness.    All 14 point review of systems negative except as described per history of present illness  EKGs/Labs/Other Studies Reviewed:      Recent Labs: 03/05/2021: BUN 10; Creatinine, Ser 0.99; Hemoglobin 13.9; Platelets 258; Potassium 4.6; Sodium 142  Recent Lipid Panel No results found for: CHOL, TRIG, HDL, CHOLHDL, VLDL, LDLCALC, LDLDIRECT  Physical Exam:    VS:  BP (!) 150/80 (BP Location: Left Arm, Patient Position: Sitting)    Pulse 79    Ht 6\' 4"  (1.93 m)    Wt (!) 346 lb 6.4 oz (157.1 kg)    SpO2 99%    BMI 42.17 kg/m     Wt Readings from Last 3 Encounters:  04/24/21 (!) 346 lb 6.4 oz (157.1 kg)  02/26/21 (!) 341 lb (154.7 kg)  02/18/21 (!) 341 lb 12.8 oz (155 kg)     GEN:  Well nourished, well developed in no acute distress HEENT: Normal NECK: No JVD; No carotid bruits LYMPHATICS: No lymphadenopathy CARDIAC: RRR, no murmurs, no rubs, no gallops RESPIRATORY:  Clear to auscultation without rales, wheezing or rhonchi  ABDOMEN: Soft, non-tender, non-distended MUSCULOSKELETAL:  No edema; No deformity  SKIN: Warm and dry LOWER EXTREMITIES: Minimal swelling NEUROLOGIC:  Alert and oriented x 3 PSYCHIATRIC:  Normal affect   ASSESSMENT:    1. Dyspnea on exertion   2. History of pulmonary embolism   3. History of DVT (deep vein thrombosis)   4. Hypercholesterolemia   5. Primary hypertension    PLAN:    In order of problems  listed above:  Dyspnea on exertion.  Doing well from that point review.  I encouraged him to be able be more active. History of DVT and PE felt as provoked however in my opinion it was not.  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  Therefore we decided to anticoagulate him definitely.  He is on Eliquis twice daily 5 mg which I will continue Essential hypertension his blood pressure is elevated today.  I will ask him to have increase lisinopril to 20 mg daily and check Chem-7 next week. Dyslipidemia I did review K  PN which show me his HDL is 31 but I do not see LDL.  We will contact primary care physician to get a copy of it.   Medication Adjustments/Labs and Tests Ordered: Current medicines are reviewed at length with the patient today.  Concerns regarding medicines are outlined above.  No orders of the defined types were placed in this encounter.  Medication changes: No orders of the defined types were placed in this encounter.   Signed, Georgeanna Lea, MD, [facility] 04/24/2021 10:19 AM    [facility]

## 2021-04-24 NOTE — Patient Instructions (Addendum)
Medication Instructions:  Your physician has recommended you make the following change in your medication:   Increase Lisinopril to 20 mg daily   *If you need a refill on your cardiac medications before your next appointment, please call your pharmacy*   Lab Work: Your physician recommends that you return for lab work in 1  week: bmp  If you have labs (blood work) drawn today and your tests are completely normal, you will receive your results only by: MyChart Message (if you have MyChart) OR A paper copy in the mail If you have any lab test that is abnormal or we need to change your treatment, we will call you to review the results.   Testing/Procedures: None   Follow-Up: At [facility], you and your health needs are our priority.  As part of our continuing mission to provide you with exceptional heart care, we have created designated Provider Care Teams.  These Care Teams include your primary Cardiologist (physician) and Advanced Practice Providers (APPs -  Physician Assistants and Nurse Practitioners) who all work together to provide you with the care you need, when you need it.  We recommend signing up for the patient portal called "MyChart".  Sign up information is provided on this After Visit Summary.  MyChart is used to connect with patients for Virtual Visits (Telemedicine).  Patients are able to view lab/test results, encounter notes, upcoming appointments, etc.  Non-urgent messages can be sent to your provider as well.   To learn more about what you can do with MyChart, go to [email or web address].    Your next appointment:   5 month(s)  The format for your next appointment:   In Person  Provider:   Gypsy Balsam, MD    Other Instructions None

## 2021-04-24 NOTE — Addendum Note (Signed)
Addended by: Roxanne Mins I on: 04/24/2021 10:42 AM   Modules accepted: Orders

## 2021-05-02 LAB — BASIC METABOLIC PANEL
BUN/Creatinine Ratio: 13 (ref 10–24)
BUN: 14 mg/dL (ref 8–27)
CO2: 24 mmol/L (ref 20–29)
Calcium: 9.7 mg/dL (ref 8.6–10.2)
Chloride: 104 mmol/L (ref 96–106)
Creatinine, Ser: 1.06 mg/dL (ref 0.76–1.27)
Glucose: 81 mg/dL (ref 70–99)
Potassium: 4.8 mmol/L (ref 3.5–5.2)
Sodium: 139 mmol/L (ref 134–144)
eGFR: 78 mL/min/{1.73_m2} (ref >59)

## 2021-10-02 ENCOUNTER — Telehealth: Payer: Self-pay

## 2021-10-02 NOTE — Telephone Encounter (Signed)
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 He was last seen Jan 2023 and has appt on 10-13-21 for a follow up. Provided last office note for Urgent care that completed DOT physical to make the determination today if they see fit. Pt is on Eliquis and will need to be seen and cleared by physician.

## 2021-10-13 ENCOUNTER — Encounter: Payer: Self-pay | Admitting: Cardiology

## 2021-10-13 ENCOUNTER — Ambulatory Visit: Payer: BC Managed Care – PPO | Admitting: Cardiology

## 2021-10-13 VITALS — BP 110/74 | HR 89 | Ht 76.0 in | Wt 339.6 lb

## 2021-10-13 DIAGNOSIS — G4733 Obstructive sleep apnea (adult) (pediatric): Secondary | ICD-10-CM | POA: Diagnosis not present

## 2021-10-13 DIAGNOSIS — E7800 Pure hypercholesterolemia, unspecified: Secondary | ICD-10-CM | POA: Diagnosis not present

## 2021-10-13 DIAGNOSIS — Z86718 Personal history of other venous thrombosis and embolism: Secondary | ICD-10-CM | POA: Diagnosis not present

## 2021-10-13 DIAGNOSIS — Z86711 Personal history of pulmonary embolism: Secondary | ICD-10-CM | POA: Diagnosis not present

## 2021-10-13 NOTE — Progress Notes (Signed)
Cardiology Office Note:    Date:  10/13/2021   ID:  ALYJAH HIRSCHMAN, DOB 12/11/1955, MRN [id number]  PCP:  Angelina Sheriff, MD  Cardiologist:  Jenne Campus, MD    Referring MD: Angelina Sheriff, MD   Chief Complaint  Patient presents with   Follow-up  Doing well  History of Present Illness:    FROYLAN MAROLT is a 66 y.o. male with past medical history significant for DVT with PE, essential hypertension, dyslipidemia.  Initially he was referred to [place] because of shortness of breath.  Echocardiogram showed preserved left ventricle ejection fraction with normal diastolic parameters, he also got a stress test which showed no evidence of ischemia.  Because of history of DVT as well as the fact when she had his pulmonary emboli he was having evidence of right ventricle overload that we decided to treat him with anticoagulation indefinitely. He comes today to my office for follow-up.  Overall doing very well.  He denies have any chest pain, tightness, pressure, burning in the chest.  Can walk climb stairs with some shortness of breath but otherwise doing well.  He is struggling with equipment for his sleep apnea but that is being arranged.  Past Medical History:  Diagnosis Date   Arthritis    Bursitis of heel 01/14/2021   Degenerative arthritis    DVT (deep venous thrombosis) (HCC)    Gastritis    Gastritis 01/14/2021   GERD (gastroesophageal reflux disease)    ulcer   GERD (gastroesophageal reflux disease) 01/14/2021   ulcer   Hypercholesterolemia    Hypertension    Morbid obesity ([place])    Obesity 04/09/2015   Primary osteoarthritis of right hip 04/09/2015   Pulmonary emboli (HCC)    Right hip pain 04/03/2013   Serum lipids high    Sleep apnea    Tendonitis of foot 06/06/2015   Tightness of left heel cord 06/06/2015    Past Surgical History:  Procedure Laterality Date   angioplasty of left popliteal and femoral veins      CATARACT EXTRACTION     COLONOSCOPY      LOWER EXTREMITY VENOGRAPHY Left 11/30/2016   Procedure: Lower Extremity Venography;  Surgeon: Waynetta Sandy, MD;  Location: [place] [facility];  Service: Cardiovascular;  Laterality: Left;   PERIPHERAL VASCULAR BALLOON ANGIOPLASTY Left 11/30/2016   Procedure: PERIPHERAL VASCULAR BALLOON ANGIOPLASTY;  Surgeon: Waynetta Sandy, MD;  Location: [place] [facility];  Service: Cardiovascular;  Laterality: Left;   TOTAL HIP ARTHROPLASTY Right 04/09/2015   Procedure: TOTAL HIP ARTHROPLASTY ANTERIOR APPROACH;  Surgeon: Melrose Nakayama, MD;  Location: [place];  Service: Orthopedics;  Laterality: Right;   WISDOM TOOTH EXTRACTION      Current Medications: Current Meds  Medication Sig   apixaban (ELIQUIS) 5 MG TABS tablet Take 1 tablet (5 mg total) by mouth 2 (two) times daily.   lisinopril (ZESTRIL) 20 MG tablet Take 1 tablet (20 mg total) by mouth daily.   Multiple Vitamin (MULTIVITAMIN WITH MINERALS) TABS tablet Take 1 tablet by mouth daily. Unknown strenghtCentrum Silver 50 plus/Unknown strength   Omega-3 Fatty Acids (FISH OIL) 1200 MG CAPS Take 2,400 mg by mouth daily.   omeprazole (PRILOSEC OTC) 20 MG tablet Take 20 mg by mouth daily before breakfast.   scopolamine (TRANSDERM-SCOP) 1 MG/3DAYS Place 1 patch onto the skin as needed (See fish traveling/ motion sickness).   simvastatin (ZOCOR) 20 MG tablet Take 20 mg by mouth at bedtime.  Allergies:   Patient has no known allergies.   Social History   Socioeconomic History   Marital status: Married    Spouse name: Not on file   Number of children: Not on file   Years of education: Not on file   Highest education level: Not on file  Occupational History   Not on file  Tobacco Use   Smoking status: Never   Smokeless tobacco: Former    Types: Associate Professor Use: Never used  Substance and Sexual Activity   Alcohol use: Yes    Comment: occ   Drug use: No   Sexual activity: Yes  Other Topics Concern   Not  on file  Social History Narrative   Not on file   Social Determinants of Health   Financial Resource Strain: Not on file  Food Insecurity: Not on file  Transportation Needs: Not on file  Physical Activity: Not on file  Stress: Not on file  Social Connections: Not on file     Family History: The patient's family history is not on file. ROS:   Please see the history of present illness.    All 14 point review of systems negative except as described per history of present illness  EKGs/Labs/Other Studies Reviewed:      Recent Labs: 03/05/2021: Hemoglobin 13.9; Platelets 258 05/01/2021: BUN 14; Creatinine, Ser 1.06; Potassium 4.8; Sodium 139  Recent Lipid Panel No results found for: "CHOL", "TRIG", "HDL", "CHOLHDL", "VLDL", "LDLCALC", "LDLDIRECT"  Physical Exam:    VS:  BP 110/74 (BP Location: Left Arm, Patient Position: Sitting)   Pulse 89   Ht 6\' 4"  (1.93 m)   Wt (!) 339 lb 9.6 oz (154 kg)   SpO2 95%   BMI 41.34 kg/m     Wt Readings from Last 3 Encounters:  10/13/21 (!) 339 lb 9.6 oz (154 kg)  04/24/21 (!) 346 lb 6.4 oz (157.1 kg)  02/26/21 (!) 341 lb (154.7 kg)     GEN:  Well nourished, well developed in no acute distress HEENT: Normal NECK: No JVD; No carotid bruits LYMPHATICS: No lymphadenopathy CARDIAC: RRR, no murmurs, no rubs, no gallops RESPIRATORY:  Clear to auscultation without rales, wheezing or rhonchi  ABDOMEN: Soft, non-tender, non-distended MUSCULOSKELETAL:  No edema; No deformity  SKIN: Warm and dry LOWER EXTREMITIES: no swelling NEUROLOGIC:  Alert and oriented x 3 PSYCHIATRIC:  Normal affect   ASSESSMENT:    1. Obstructive sleep apnea syndrome   2. History of pulmonary embolism   3. Hypercholesterolemia   4. History of DVT (deep vein thrombosis)    PLAN:    In order of problems listed above:  History of DVT/pulmonary emboli.  Anticoagulated continue no new issues.  Echocardiogram done in November of last year showed normal right  ventricle size and function with normal pulm artery pressure, left ventricle was normal as well.  Diastolic parameters were normal.  We will continue present management. Dyslipidemia I did review his K PN which show me total cholesterol of 126 which is HDL 31 however that number is from September of last year.  I will call primary care physician to get more updated information's. Obstructive sleep apnea.  He is getting some equipment changed.  Seems to doing well overall.  He likes his CPAP and he uses 97% of time   Medication Adjustments/Labs and Tests Ordered: Current medicines are reviewed at length with the patient today.  Concerns regarding medicines are outlined above.  No orders  of the defined types were placed in this encounter.  Medication changes: No orders of the defined types were placed in this encounter.   Signed, Georgeanna Lea, MD, [facility] 10/13/2021 9:43 AM    [facility]

## 2021-10-22 ENCOUNTER — Other Ambulatory Visit: Payer: Self-pay | Admitting: Cardiology

## 2022-02-27 ENCOUNTER — Other Ambulatory Visit: Payer: Self-pay | Admitting: Cardiology

## 2022-02-27 DIAGNOSIS — I824Y2 Acute embolism and thrombosis of unspecified deep veins of left proximal lower extremity: Secondary | ICD-10-CM

## 2022-03-02 NOTE — Telephone Encounter (Signed)
Prescription refill request for Eliquis received. Indication:dvt Last office visit:6/23 Scr:1.0 Age: 66 Weight:154 kg  Prescription refilled

## 2022-05-06 ENCOUNTER — Ambulatory Visit: Payer: BC Managed Care – PPO | Attending: Cardiology | Admitting: Cardiology

## 2022-05-07 ENCOUNTER — Encounter: Payer: Self-pay | Admitting: Cardiology

## 2022-05-22 ENCOUNTER — Ambulatory Visit: Payer: BLUE CROSS/BLUE SHIELD | Attending: Cardiology | Admitting: Cardiology

## 2022-05-22 ENCOUNTER — Encounter: Payer: Self-pay | Admitting: Cardiology

## 2022-05-22 VITALS — BP 130/90 | HR 75 | Ht 76.0 in | Wt 348.9 lb

## 2022-05-22 DIAGNOSIS — Z86711 Personal history of pulmonary embolism: Secondary | ICD-10-CM

## 2022-05-22 DIAGNOSIS — G4733 Obstructive sleep apnea (adult) (pediatric): Secondary | ICD-10-CM

## 2022-05-22 DIAGNOSIS — Z86718 Personal history of other venous thrombosis and embolism: Secondary | ICD-10-CM

## 2022-05-22 DIAGNOSIS — E7800 Pure hypercholesterolemia, unspecified: Secondary | ICD-10-CM | POA: Diagnosis not present

## 2022-05-22 DIAGNOSIS — I10 Essential (primary) hypertension: Secondary | ICD-10-CM | POA: Diagnosis not present

## 2022-05-22 NOTE — Patient Instructions (Signed)

## 2022-05-22 NOTE — Progress Notes (Signed)
Cardiology Office Note:    Date:  05/22/2022   ID:  Joseph Bruce, DOB 1956-01-01, MRN [id number]  PCP:  Angelina Sheriff, MD  Cardiologist:  Jenne Campus, MD    Referring MD: Angelina Sheriff, MD   Chief Complaint  Patient presents with   Follow-up         History of Present Illness:    Joseph Bruce is a 67 y.o. male with past medical history significant for DVT, PE, essential hypertension, dyslipidemia.  With history he was referred to [place] because of shortness of breath, cardiac workup has been negative.  He did have an echocardiogram which showed normal left ventricle ejection fraction, normal diastolic function, he also had stress test done which showed no evidence of ischemia.  Because of history of DVT and PE which was unprovoked he is being anticoagulated and definitely.  Additional problem include obstructive sleep apnea for which he is CPAP mask. He is in my office today.  Overall doing well.  He denies have any chest pain tightness squeezing pressure burning chest no palpitations dizziness swelling of lower extremities.  He got his equipment set up for CPAP he thinks it helps him a lot and he is enjoying it.  Past Medical History:  Diagnosis Date   Arthritis    Bursitis of heel 01/14/2021   Degenerative arthritis    DVT (deep venous thrombosis) (HCC)    Gastritis    Gastritis 01/14/2021   GERD (gastroesophageal reflux disease)    ulcer   GERD (gastroesophageal reflux disease) 01/14/2021   ulcer   Hypercholesterolemia    Hypertension    Morbid obesity ([place])    Obesity 04/09/2015   Primary osteoarthritis of right hip 04/09/2015   Pulmonary emboli (HCC)    Right hip pain 04/03/2013   Serum lipids high    Sleep apnea    Tendonitis of foot 06/06/2015   Tightness of left heel cord 06/06/2015    Past Surgical History:  Procedure Laterality Date   angioplasty of left popliteal and femoral veins      CATARACT EXTRACTION     COLONOSCOPY     LOWER EXTREMITY  VENOGRAPHY Left 11/30/2016   Procedure: Lower Extremity Venography;  Surgeon: Waynetta Sandy, MD;  Location: [place] [facility];  Service: Cardiovascular;  Laterality: Left;   PERIPHERAL VASCULAR BALLOON ANGIOPLASTY Left 11/30/2016   Procedure: PERIPHERAL VASCULAR BALLOON ANGIOPLASTY;  Surgeon: Waynetta Sandy, MD;  Location: [place] [facility];  Service: Cardiovascular;  Laterality: Left;   TOTAL HIP ARTHROPLASTY Right 04/09/2015   Procedure: TOTAL HIP ARTHROPLASTY ANTERIOR APPROACH;  Surgeon: Melrose Nakayama, MD;  Location: [place];  Service: Orthopedics;  Laterality: Right;   WISDOM TOOTH EXTRACTION      Current Medications: Current Meds  Medication Sig   ELIQUIS 5 MG TABS tablet TAKE 1 TABLET BY MOUTH TWICE A DAY   lisinopril (ZESTRIL) 20 MG tablet TAKE 1 TABLET BY MOUTH EVERY DAY (Patient taking differently: Take 20 mg by mouth daily.)   Multiple Vitamin (MULTIVITAMIN WITH MINERALS) TABS tablet Take 1 tablet by mouth daily. Unknown strenghtCentrum Silver 50 plus/Unknown strength   Omega-3 Fatty Acids (FISH OIL) 1200 MG CAPS Take 2,400 mg by mouth daily.   scopolamine (TRANSDERM-SCOP) 1 MG/3DAYS Place 1 patch onto the skin as needed (See fish traveling/ motion sickness).   simvastatin (ZOCOR) 20 MG tablet Take 20 mg by mouth at bedtime.   [DISCONTINUED] omeprazole (PRILOSEC OTC) 20 MG tablet Take 20  mg by mouth daily before breakfast.     Allergies:   Patient has no known allergies.   Social History   Socioeconomic History   Marital status: Married    Spouse name: Not on file   Number of children: Not on file   Years of education: Not on file   Highest education level: Not on file  Occupational History   Not on file  Tobacco Use   Smoking status: Never   Smokeless tobacco: Former    Types: Nurse, children's Use: Never used  Substance and Sexual Activity   Alcohol use: Yes    Comment: occ   Drug use: No   Sexual activity: Yes  Other Topics  Concern   Not on file  Social History Narrative   Not on file   Social Determinants of Health   Financial Resource Strain: Not on file  Food Insecurity: Not on file  Transportation Needs: Not on file  Physical Activity: Not on file  Stress: Not on file  Social Connections: Not on file     Family History: The patient's family history is not on file. ROS:   Please see the history of present illness.    All 14 point review of systems negative except as described per history of present illness  EKGs/Labs/Other Studies Reviewed:      Recent Labs: No results found for requested labs within last 365 days.  Recent Lipid Panel No results found for: "CHOL", "TRIG", "HDL", "CHOLHDL", "VLDL", "LDLCALC", "LDLDIRECT"  Physical Exam:    VS:  BP (!) 130/90 (BP Location: Left Arm, Patient Position: Sitting)   Pulse 75   Ht 6\' 4"  (1.93 m)   Wt (!) 348 lb 14.4 oz (158.3 kg)   SpO2 94%   BMI 42.47 kg/m     Wt Readings from Last 3 Encounters:  05/22/22 (!) 348 lb 14.4 oz (158.3 kg)  10/13/21 (!) 339 lb 9.6 oz (154 kg)  04/24/21 (!) 346 lb 6.4 oz (157.1 kg)     GEN:  Well nourished, well developed in no acute distress HEENT: Normal NECK: No JVD; No carotid bruits LYMPHATICS: No lymphadenopathy CARDIAC: RRR, no murmurs, no rubs, no gallops RESPIRATORY:  Clear to auscultation without rales, wheezing or rhonchi  ABDOMEN: Soft, non-tender, non-distended MUSCULOSKELETAL:  No edema; No deformity  SKIN: Warm and dry LOWER EXTREMITIES: no swelling NEUROLOGIC:  Alert and oriented x 3 PSYCHIATRIC:  Normal affect   ASSESSMENT:    1. History of DVT (deep vein thrombosis)   2. History of pulmonary embolism   3. Hypercholesterolemia   4. Primary hypertension   5. Obstructive sleep apnea syndrome    PLAN:    In order of problems listed above:  History of DVT history of PE.  Continue anticoagulation he is anticoag with Eliquis. Dyslipidemia I did review his K PN which show me his  total cholesterol 118 HDL 32.  Will continue present management which include Zocor 20. Obstructive sleep apnea he is using CPAP mask and he is very happy with this. Morbid obesity obviously a problem he understand is an issue and he is trying to work on the losing some weight.   Medication Adjustments/Labs and Tests Ordered: Current medicines are reviewed at length with the patient today.  Concerns regarding medicines are outlined above.  No orders of the defined types were placed in this encounter.  Medication changes: No orders of the defined types were placed in this encounter.  Signed, Park Liter, MD, [facility] 05/22/2022 [place]

## 2022-06-17 ENCOUNTER — Telehealth: Payer: Self-pay

## 2022-06-17 NOTE — Telephone Encounter (Signed)
   Pre-operative Risk Assessment    Patient Name: Joseph Bruce  DOB: 1955/12/07 MRN: [id number]      Request for Surgical Clearance    Procedure:   colonoscopy  Date of Surgery:  Clearance 07/15/22                                 Surgeon:  Dr. Christia Reading [place] Surgeon's Group or Practice Name:  [facility] Phone number:   Fax number:  [phone]   Type of Clearance Requested:   - Pharmacy:  Hold Apixaban (Eliquis) please advise   Type of Anesthesia:  Not Indicated   Additional requests/questions:    Gretchen Short   06/17/2022, 3:13 PM

## 2022-06-19 NOTE — Telephone Encounter (Signed)
Patient with diagnosis of PE/DVT on Eliquis for anticoagulation.    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   Patient will not need bridging with Lovenox (enoxaparin) around procedure.  **This guidance is not considered finalized until pre-operative APP has relayed final recommendations.**

## 2022-06-19 NOTE — Telephone Encounter (Signed)
   Patient Name: JONADAB RECKARD  DOB: 1955-06-05 MRN: [id number]  Primary Cardiologist: None  Chart reviewed as part of pre-operative protocol coverage. Pre-op clearance already addressed by colleagues in earlier phone notes. To summarize recommendations:  -Per office protocol, patient can hold Eliquis for 1 days prior to procedure.   Patient will not need bridging with Lovenox (enoxaparin) around procedure.  Medical clearance not requested.  Will route this bundled recommendation to requesting provider via Epic fax function and remove from pre-op pool. Please call with questions.  Elgie Collard, PA-C 06/19/2022, 8:15 AM

## 2022-11-12 ENCOUNTER — Other Ambulatory Visit: Payer: Self-pay | Admitting: Cardiology

## 2022-11-12 DIAGNOSIS — I10 Essential (primary) hypertension: Secondary | ICD-10-CM

## 2022-11-25 ENCOUNTER — Encounter: Payer: Self-pay | Admitting: Cardiology

## 2022-11-25 ENCOUNTER — Ambulatory Visit: Payer: Medicare Other | Attending: Cardiology | Admitting: Cardiology

## 2022-11-25 VITALS — BP 142/88 | HR 76 | Ht 76.0 in | Wt 347.0 lb

## 2022-11-25 DIAGNOSIS — Z86711 Personal history of pulmonary embolism: Secondary | ICD-10-CM | POA: Diagnosis present

## 2022-11-25 DIAGNOSIS — I10 Essential (primary) hypertension: Secondary | ICD-10-CM | POA: Insufficient documentation

## 2022-11-25 DIAGNOSIS — R0609 Other forms of dyspnea: Secondary | ICD-10-CM | POA: Insufficient documentation

## 2022-11-25 DIAGNOSIS — Z86718 Personal history of other venous thrombosis and embolism: Secondary | ICD-10-CM | POA: Diagnosis present

## 2022-11-25 NOTE — Addendum Note (Signed)
Addended by: Baldo Ash D on: 11/25/2022 04:35 PM   Modules accepted: Orders

## 2022-11-25 NOTE — Progress Notes (Signed)
Cardiology Office Note:    Date:  11/25/2022   ID:  Joseph Bruce, DOB 29-Apr-1955, MRN [id number]  PCP:  Noni Saupe, MD  Cardiologist:  Gypsy Balsam, MD    Referring MD: Noni Saupe, MD   Chief Complaint  Patient presents with   Follow-up    History of Present Illness:    Joseph Bruce is a 67 y.o. male with past medical history significant for DVT PE which was unprovoked, anticoagulated, essential hypertension, dyslipidemia.  Cardiac workup so far showed preserved ejection fraction, stress test was negative.  Comes today to months for follow-up he complained of being weak tired exhausted.  Denies have any chest pain tightness squeezing pressure mid chest minimal swelling of lower extremities at evening time.  Past Medical History:  Diagnosis Date   Arthritis    Bursitis of heel 01/14/2021   Degenerative arthritis    DVT (deep venous thrombosis) (HCC)    Gastritis    Gastritis 01/14/2021   GERD (gastroesophageal reflux disease)    ulcer   GERD (gastroesophageal reflux disease) 01/14/2021   ulcer   Hypercholesterolemia    Hypertension    Morbid obesity (HCC)    Obesity 04/09/2015   Primary osteoarthritis of right hip 04/09/2015   Pulmonary emboli (HCC)    Right hip pain 04/03/2013   Serum lipids high    Sleep apnea    Tendonitis of foot 06/06/2015   Tightness of left heel cord 06/06/2015    Past Surgical History:  Procedure Laterality Date   angioplasty of left popliteal and femoral veins      CATARACT EXTRACTION     COLONOSCOPY     LOWER EXTREMITY VENOGRAPHY Left 11/30/2016   Procedure: Lower Extremity Venography;  Surgeon: Maeola Harman, MD;  Location: [facility];  Service: Cardiovascular;  Laterality: Left;   PERIPHERAL VASCULAR BALLOON ANGIOPLASTY Left 11/30/2016   Procedure: PERIPHERAL VASCULAR BALLOON ANGIOPLASTY;  Surgeon: Maeola Harman, MD;  Location: [facility];  Service: Cardiovascular;   Laterality: Left;   TOTAL HIP ARTHROPLASTY Right 04/09/2015   Procedure: TOTAL HIP ARTHROPLASTY ANTERIOR APPROACH;  Surgeon: Marcene Corning, MD;  Location: [facility];  Service: Orthopedics;  Laterality: Right;   WISDOM TOOTH EXTRACTION      Current Medications: Current Meds  Medication Sig   ELIQUIS 5 MG TABS tablet TAKE 1 TABLET BY MOUTH TWICE A DAY   lisinopril (ZESTRIL) 20 MG tablet Take 1 tablet (20 mg total) by mouth daily.   Multiple Vitamin (MULTIVITAMIN WITH MINERALS) TABS tablet Take 1 tablet by mouth daily. Unknown strenghtCentrum Silver 50 plus/Unknown strength   Omega-3 Fatty Acids (FISH OIL) 1200 MG CAPS Take 2,400 mg by mouth daily.   scopolamine (TRANSDERM-SCOP) 1 MG/3DAYS Place 1 patch onto the skin as needed (See fish traveling/ motion sickness).   simvastatin (ZOCOR) 20 MG tablet Take 20 mg by mouth at bedtime.   [DISCONTINUED] lisinopril (ZESTRIL) 20 MG tablet TAKE 1 TABLET BY MOUTH EVERY DAY (Patient taking differently: Take 20 mg by mouth daily.)     Allergies:   Patient has no known allergies.   Social History   Socioeconomic History   Marital status: Married    Spouse name: Not on file   Number of children: Not on file   Years of education: Not on file   Highest education level: Not on file  Occupational History   Not on file  Tobacco Use   Smoking status: Never  Smokeless tobacco: Former    Types: Associate Professor status: Never Used  Substance and Sexual Activity   Alcohol use: Yes    Comment: occ   Drug use: No   Sexual activity: Yes  Other Topics Concern   Not on file  Social History Narrative   Not on file   Social Determinants of Health   Financial Resource Strain: Not on file  Food Insecurity: Not on file  Transportation Needs: Not on file  Physical Activity: Not on file  Stress: Not on file  Social Connections: Not on file     Family History: The patient's family history is not on file. ROS:   Please see the history of  present illness.    All 14 point review of systems negative except as described per history of present illness  EKGs/Labs/Other Studies Reviewed:         Recent Labs: No results found for requested labs within last 365 days.  Recent Lipid Panel No results found for: "CHOL", "TRIG", "HDL", "CHOLHDL", "VLDL", "LDLCALC", "LDLDIRECT"  Physical Exam:    VS:  BP (!) 142/88 (BP Location: Left Arm, Patient Position: Sitting)   Pulse 76   Ht 6\' 4"  (1.93 m)   Wt (!) 347 lb (157.4 kg)   SpO2 94%   BMI 42.24 kg/m     Wt Readings from Last 3 Encounters:  11/25/22 (!) 347 lb (157.4 kg)  05/22/22 (!) 348 lb 14.4 oz (158.3 kg)  10/13/21 (!) 339 lb 9.6 oz (154 kg)     GEN:  Well nourished, well developed in no acute distress HEENT: Normal NECK: No JVD; No carotid bruits LYMPHATICS: No lymphadenopathy CARDIAC: RRR, no murmurs, no rubs, no gallops RESPIRATORY:  Clear to auscultation without rales, wheezing or rhonchi  ABDOMEN: Soft, non-tender, non-distended MUSCULOSKELETAL:  No edema; No deformity  SKIN: Warm and dry LOWER EXTREMITIES: no swelling NEUROLOGIC:  Alert and oriented x 3 PSYCHIATRIC:  Normal affect   ASSESSMENT:    1. History of pulmonary embolism   2. History of DVT (deep vein thrombosis)   3. Dyspnea on exertion   4. Primary hypertension    PLAN:    In order of problems listed above:  History of pulmonary emboli, history of DVT, he is anticoagulated Eliquis 5  Twice daily dose is appropriate will continue. Dyspnea exertion I will schedule him to have an echocardiogram.  I suspect dyspnea exertion is mostly related to his obesity but I want a make sure his left ventricle ejection fraction is preserved.  He can benefit from medication to help him to lose weight Wegovy should be considered. Essential hypertension blood pressure well-controlled continue present management   Medication Adjustments/Labs and Tests Ordered: Current medicines are reviewed at length  with the patient today.  Concerns regarding medicines are outlined above.  No orders of the defined types were placed in this encounter.  Medication changes: No orders of the defined types were placed in this encounter.   Signed, Georgeanna Lea, MD, [facility] 11/25/2022 4:22 PM    [facility]

## 2022-11-25 NOTE — Patient Instructions (Addendum)

## 2022-12-09 ENCOUNTER — Ambulatory Visit: Payer: Medicare Other | Attending: Cardiology

## 2022-12-09 DIAGNOSIS — R0609 Other forms of dyspnea: Secondary | ICD-10-CM | POA: Insufficient documentation

## 2022-12-09 LAB — ECHOCARDIOGRAM COMPLETE: S' Lateral: 3.3 cm

## 2022-12-10 ENCOUNTER — Telehealth: Payer: Self-pay | Admitting: Cardiology

## 2022-12-10 NOTE — Telephone Encounter (Signed)
Pt returning nurses call regarding results. Please advise 

## 2022-12-10 NOTE — Telephone Encounter (Signed)
Joseph Lea, MD 12/10/2022 10:31 AM EDT     Echocardiogram showed preserved left ventricle ejection fraction, aorta mildly enlarged 41 mm.  Medical therapy     Results discussed with patient, no questions offered, I will copy pcp

## 2022-12-15 ENCOUNTER — Telehealth: Payer: Self-pay

## 2022-12-15 NOTE — Telephone Encounter (Signed)
Echo Results reviewed with pt as per Dr. Krasowski's note.  Pt verbalized understanding and had no additional questions. Routed to PCP 

## 2023-04-01 ENCOUNTER — Other Ambulatory Visit: Payer: Self-pay | Admitting: Cardiology

## 2023-04-01 DIAGNOSIS — I824Y2 Acute embolism and thrombosis of unspecified deep veins of left proximal lower extremity: Secondary | ICD-10-CM

## 2023-04-01 NOTE — Telephone Encounter (Signed)
Pt last saw Dr Bing Matter 11/25/22, last labs 01/01/22 Creat 1.2, pt is overdue for labwork.  Age 67, weight 157.4kg, based on specified criteria pt is on appropriate dosage of Eliquis 5mg  BID for .  Pt will need labwork for rx refill.  Called spoke with pt, he states he saw his new PCP Dr Ralene Cork with [facility] in fall 2024 and had labwork.  Called pt's PCP requested labwork, verified pt did have labs 12/2022 will fax to our office.  Will await fax to refill rx.

## 2023-04-01 NOTE — Telephone Encounter (Signed)
Labs received. Scr 1.3 on 01/11/23. Appropriate dose. Refill sent.

## 2023-06-02 ENCOUNTER — Encounter: Payer: Self-pay | Admitting: Cardiology

## 2023-06-04 ENCOUNTER — Encounter: Payer: Self-pay | Admitting: Cardiology

## 2023-06-04 ENCOUNTER — Ambulatory Visit: Payer: Medicare Other | Attending: Cardiology | Admitting: Cardiology

## 2023-06-04 VITALS — BP 126/80 | HR 89 | Ht 76.0 in | Wt 350.0 lb

## 2023-06-04 DIAGNOSIS — Z86718 Personal history of other venous thrombosis and embolism: Secondary | ICD-10-CM | POA: Insufficient documentation

## 2023-06-04 DIAGNOSIS — R0609 Other forms of dyspnea: Secondary | ICD-10-CM | POA: Diagnosis present

## 2023-06-04 DIAGNOSIS — M25551 Pain in right hip: Secondary | ICD-10-CM | POA: Diagnosis present

## 2023-06-04 DIAGNOSIS — I10 Essential (primary) hypertension: Secondary | ICD-10-CM | POA: Diagnosis present

## 2023-06-04 DIAGNOSIS — Z86711 Personal history of pulmonary embolism: Secondary | ICD-10-CM | POA: Insufficient documentation

## 2023-06-04 NOTE — Patient Instructions (Signed)

## 2023-06-04 NOTE — Progress Notes (Signed)
Cardiology Office Note:    Date:  06/04/2023   ID:  Joseph Bruce, DOB 1956/01/27, MRN [id number]  PCP:  Annamaria Helling, DO  Cardiologist:  Gypsy Balsam, MD    Referring MD: Noni Saupe, MD   Chief Complaint  Patient presents with   Follow-up    History of Present Illness:    Joseph Bruce is a 68 y.o. 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  Last time I have seen him in the summer he was complaining of being weak tired exhausted we did echocardiogram to assess left ventricle ejection fraction which showed basically normal heart there were no significant valve pathology.  Since that time he is doing fine.  She still tries to be active.  Just few days ago he ended up in the emergency room because of back pain.  CT of his back was fine he was told to have degenerative disc disease.  Cardiac wise no symptoms there is no chest pain tightness squeezing pressure burning chest.  He does what he wants to do  Past Medical History:  Diagnosis Date   Arthritis    Bursitis of heel 01/14/2021   Degenerative arthritis    DVT (deep venous thrombosis) (HCC)    Gastritis    Gastritis 01/14/2021   GERD (gastroesophageal reflux disease)    ulcer   GERD (gastroesophageal reflux disease) 01/14/2021   ulcer   Hypercholesterolemia    Hypertension    Morbid obesity (HCC)    Obesity 04/09/2015   Primary osteoarthritis of right hip 04/09/2015   Pulmonary emboli (HCC)    Right hip pain 04/03/2013   Serum lipids high    Sleep apnea    Tendonitis of foot 06/06/2015   Tightness of left heel cord 06/06/2015    Past Surgical History:  Procedure Laterality Date   angioplasty of left popliteal and femoral veins      CATARACT EXTRACTION     COLONOSCOPY     LOWER EXTREMITY VENOGRAPHY Left 11/30/2016   Procedure: Lower Extremity Venography;  Surgeon: Maeola Harman, MD;  Location: [facility]  INVASIVE CV LAB;  Service: Cardiovascular;  Laterality: Left;   PERIPHERAL VASCULAR BALLOON ANGIOPLASTY Left 11/30/2016   Procedure: PERIPHERAL VASCULAR BALLOON ANGIOPLASTY;  Surgeon: Maeola Harman, MD;  Location: [facility] INVASIVE CV LAB;  Service: Cardiovascular;  Laterality: Left;   TOTAL HIP ARTHROPLASTY Right 04/09/2015   Procedure: TOTAL HIP ARTHROPLASTY ANTERIOR APPROACH;  Surgeon: Marcene Corning, MD;  Location: [facility] OR;  Service: Orthopedics;  Laterality: Right;   WISDOM TOOTH EXTRACTION      Current Medications: Current Meds  Medication Sig   apixaban (ELIQUIS) 5 MG TABS tablet TAKE 1 TABLET BY MOUTH TWICE A DAY   CYCLOBENZAPRINE HCL PO Take 1 tablet by mouth as needed (spasm).   lisinopril (ZESTRIL) 20 MG tablet Take 1 tablet (20 mg total) by mouth daily.   Multiple Vitamin (MULTIVITAMIN WITH MINERALS) TABS tablet Take 1 tablet by mouth daily. Unknown strenghtCentrum Silver 50 plus/Unknown strength   Omega-3 Fatty Acids (FISH OIL) 1200 MG CAPS Take 2,400 mg by mouth daily.   PREDNISONE, PAK, PO Take 1 tablet by mouth as directed.   scopolamine (TRANSDERM-SCOP) 1 MG/3DAYS Place 1 patch onto the skin as needed (See fish traveling/ motion sickness).   simvastatin (ZOCOR) 20 MG tablet Take 20 mg by mouth at bedtime.     Allergies:   Patient has no known allergies.  Social History   Socioeconomic History   Marital status: Married    Spouse name: Not on file   Number of children: Not on file   Years of education: Not on file   Highest education level: Not on file  Occupational History   Not on file  Tobacco Use   Smoking status: Never   Smokeless tobacco: Former    Types: Designer, multimedia Use   Vaping status: Never Used  Substance and Sexual Activity   Alcohol use: Yes    Comment: occ   Drug use: No   Sexual activity: Yes  Other Topics Concern   Not on file  Social History Narrative   Not on file   Social Drivers of Health   Financial Resource Strain: Not on  file  Food Insecurity: Not on file  Transportation Needs: Not on file  Physical Activity: Not on file  Stress: Not on file  Social Connections: Not on file     Family History: The patient's family history is not on file. ROS:   Please see the history of present illness.    All 14 point review of systems negative except as described per history of present illness  EKGs/Labs/Other Studies Reviewed:         Recent Labs: No results found for requested labs within last 365 days.  Recent Lipid Panel No results found for: "CHOL", "TRIG", "HDL", "CHOLHDL", "VLDL", "LDLCALC", "LDLDIRECT"  Physical Exam:    VS:  BP 126/80 (BP Location: Right Arm, Patient Position: Sitting)   Pulse 89   Ht 6\' 4"  (1.93 m)   Wt (!) 350 lb (158.8 kg)   SpO2 96%   BMI 42.60 kg/m     Wt Readings from Last 3 Encounters:  06/04/23 (!) 350 lb (158.8 kg)  11/25/22 (!) 347 lb (157.4 kg)  05/22/22 (!) 348 lb 14.4 oz (158.3 kg)     GEN:  Well nourished, well developed in no acute distress HEENT: Normal NECK: No JVD; No carotid bruits LYMPHATICS: No lymphadenopathy CARDIAC: RRR, no murmurs, no rubs, no gallops RESPIRATORY:  Clear to auscultation without rales, wheezing or rhonchi  ABDOMEN: Soft, non-tender, non-distended MUSCULOSKELETAL:  No edema; No deformity  SKIN: Warm and dry LOWER EXTREMITIES: no swelling NEUROLOGIC:  Alert and oriented x 3 PSYCHIATRIC:  Normal affect   ASSESSMENT:    1. Primary hypertension   2. History of DVT (deep vein thrombosis)   3. History of pulmonary embolism   4. Right hip pain   5. Dyspnea on exertion    PLAN:    In order of problems listed above:  Essential hypertension blood pressure controlled continue present management. History of DVT PE on anticoagulation which I will continue dose is appropriate for his weight age and kidney function.  I did review echocardiogram showed normal pulmonary pressure. Right hip pain arthritis followed by antimedicine  team. Dyspnea on exertion only mild. Dyslipidemia I did review K PN which show me total cholesterol 98 HDL 28 continue present management   Medication Adjustments/Labs and Tests Ordered: Current medicines are reviewed at length with the patient today.  Concerns regarding medicines are outlined above.  No orders of the defined types were placed in this encounter.  Medication changes: No orders of the defined types were placed in this encounter.   Signed, Georgeanna Lea, MD, [facility] 06/04/2023 8:14 AM    [facility]

## 2023-09-25 ENCOUNTER — Other Ambulatory Visit: Payer: Self-pay | Admitting: Cardiology

## 2023-09-25 DIAGNOSIS — I824Y2 Acute embolism and thrombosis of unspecified deep veins of left proximal lower extremity: Secondary | ICD-10-CM

## 2023-09-27 NOTE — Telephone Encounter (Signed)
 Prescription refill request for Eliquis  received. Indication: PE/DVT Last office visit: 06/04/23  Otilio Block MD Scr: 1.1 on 07/16/23  KPN Age: 68 Weight: 158.8kg  Based on above findings Eliquis  5mg  twice daily is the appropriate dose.  Refill approved.

## 2023-10-18 ENCOUNTER — Other Ambulatory Visit: Payer: Self-pay | Admitting: Cardiology

## 2023-10-18 DIAGNOSIS — I10 Essential (primary) hypertension: Secondary | ICD-10-CM

## 2023-12-06 ENCOUNTER — Ambulatory Visit: Attending: Cardiology | Admitting: Cardiology

## 2023-12-06 ENCOUNTER — Encounter: Payer: Self-pay | Admitting: Cardiology

## 2023-12-06 VITALS — BP 160/82 | HR 69 | Ht 76.0 in | Wt 348.0 lb

## 2023-12-06 DIAGNOSIS — I10 Essential (primary) hypertension: Secondary | ICD-10-CM | POA: Diagnosis not present

## 2023-12-06 DIAGNOSIS — E7800 Pure hypercholesterolemia, unspecified: Secondary | ICD-10-CM | POA: Insufficient documentation

## 2023-12-06 DIAGNOSIS — Z86718 Personal history of other venous thrombosis and embolism: Secondary | ICD-10-CM | POA: Insufficient documentation

## 2023-12-06 DIAGNOSIS — Z86711 Personal history of pulmonary embolism: Secondary | ICD-10-CM | POA: Diagnosis not present

## 2023-12-06 NOTE — Patient Instructions (Signed)

## 2023-12-06 NOTE — Progress Notes (Unsigned)
 Cardiology Office Note:    Date:  12/06/2023   ID:  Joseph Bruce, DOB Dec 20, 1955, MRN [id number]  PCP:  Conley Dene BROCKS, DO  Cardiologist:  Lamar Fitch, MD    Referring MD: Conley Dene BROCKS, DO   Chief Complaint  Patient presents with   Follow-up    History of Present Illness:    Joseph Bruce is a 68 y.o. 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  Overall doing great.  Still active still loves fishing and he talks about fishing a lot with me.  No chest pain tightness squeezing pressure burning chest  Past Medical History:  Diagnosis Date   Arthritis    Bursitis of heel 01/14/2021   Degenerative arthritis    DVT (deep venous thrombosis) (HCC)    Gastritis    Gastritis 01/14/2021   GERD (gastroesophageal reflux disease)    ulcer   GERD (gastroesophageal reflux disease) 01/14/2021   ulcer   Hypercholesterolemia    Hypertension    Morbid obesity (HCC)    Obesity 04/09/2015   Primary osteoarthritis of right hip 04/09/2015   Pulmonary emboli (HCC)    Right hip pain 04/03/2013   Serum lipids high    Sleep apnea    Tendonitis of foot 06/06/2015   Tightness of left heel cord 06/06/2015    Past Surgical History:  Procedure Laterality Date   angioplasty of left popliteal and femoral veins      CATARACT EXTRACTION     COLONOSCOPY     LOWER EXTREMITY VENOGRAPHY Left 11/30/2016   Procedure: Lower Extremity Venography;  Surgeon: Sheree Penne Bruckner, MD;  Location: [facility] INVASIVE CV LAB;  Service: Cardiovascular;  Laterality: Left;   PERIPHERAL VASCULAR BALLOON ANGIOPLASTY Left 11/30/2016   Procedure: PERIPHERAL VASCULAR BALLOON ANGIOPLASTY;  Surgeon: Sheree Penne Bruckner, MD;  Location: [facility] INVASIVE CV LAB;  Service: Cardiovascular;  Laterality: Left;   TOTAL HIP ARTHROPLASTY Right 04/09/2015   Procedure: TOTAL HIP ARTHROPLASTY  ANTERIOR APPROACH;  Surgeon: Maude Herald, MD;  Location: [facility] OR;  Service: Orthopedics;  Laterality: Right;   WISDOM TOOTH EXTRACTION      Current Medications: Current Meds  Medication Sig   ELIQUIS  5 MG TABS tablet TAKE 1 TABLET BY MOUTH TWICE A DAY   lisinopril  (ZESTRIL ) 20 MG tablet TAKE 1 TABLET BY MOUTH EVERY DAY   Multiple Vitamin (MULTIVITAMIN WITH MINERALS) TABS tablet Take 1 tablet by mouth daily. Unknown strenghtCentrum Silver 50 plus/Unknown strength   Omega-3 Fatty Acids (FISH OIL) 1200 MG CAPS Take 2,400 mg by mouth daily.   scopolamine (TRANSDERM-SCOP) 1 MG/3DAYS Place 1 patch onto the skin as needed (See fish traveling/ motion sickness).   simvastatin  (ZOCOR ) 20 MG tablet Take 20 mg by mouth at bedtime.     Allergies:   Patient has no known allergies.   Social History   Socioeconomic History   Marital status: Married    Spouse name: Not on file   Number of children: Not on file   Years of education: Not on file   Highest education level: Not on file  Occupational History   Not on file  Tobacco Use   Smoking status: Never   Smokeless tobacco: Former    Types: Designer, multimedia Use   Vaping status: Never Used  Substance and Sexual Activity   Alcohol use: Yes    Comment: occ   Drug use: No  Sexual activity: Yes  Other Topics Concern   Not on file  Social History Narrative   Not on file   Social Drivers of Health   Financial Resource Strain: Not on file  Food Insecurity: Not on file  Transportation Needs: Not on file  Physical Activity: Not on file  Stress: Not on file  Social Connections: Not on file     Family History: The patient's family history is not on file. ROS:   Please see the history of present illness.    All 14 point review of systems negative except as described per history of present illness  EKGs/Labs/Other Studies Reviewed:    EKG Interpretation Date/Time:  Monday December 06 2023 08:38:44 EDT Ventricular Rate:  69 PR  Interval:  182 QRS Duration:  100 QT Interval:  376 QTC Calculation: 402 R Axis:   3  Text Interpretation: Normal sinus rhythm Normal ECG When compared with ECG of 16-Nov-2016 11:34, No significant change was found Confirmed by Bernie [place] [phone]) on 12/06/2023 8:45:35 AM    Recent Labs: No results found for requested labs within last 365 days.  Recent Lipid Panel No results found for: CHOL, TRIG, HDL, CHOLHDL, VLDL, LDLCALC, LDLDIRECT  Physical Exam:    VS:  BP (!) 160/82   Pulse 69   Ht 6' 4 (1.93 m)   Wt (!) 348 lb (157.9 kg)   SpO2 93%   BMI 42.36 kg/m     Wt Readings from Last 3 Encounters:  12/06/23 (!) 348 lb (157.9 kg)  06/04/23 (!) 350 lb (158.8 kg)  11/25/22 (!) 347 lb (157.4 kg)     GEN:  Well nourished, well developed in no acute distress HEENT: Normal NECK: No JVD; No carotid bruits LYMPHATICS: No lymphadenopathy CARDIAC: RRR, no murmurs, no rubs, no gallops RESPIRATORY:  Clear to auscultation without rales, wheezing or rhonchi  ABDOMEN: Soft, non-tender, non-distended MUSCULOSKELETAL:  No edema; No deformity  SKIN: Warm and dry LOWER EXTREMITIES: no swelling NEUROLOGIC:  Alert and oriented x 3 PSYCHIATRIC:  Normal affect   ASSESSMENT:    1. Primary hypertension   2. History of DVT (deep vein thrombosis)   3. History of pulmonary embolism   4. Hypercholesterolemia    PLAN:    In order of problems listed above:  Essential hypertension blood pressure elevated will recheck before he leaves the room.  If blood pressure still elevated then we increased dose of Zestril , History of DVT PE, anticoagulated no new recurrences unprovoked will continue anticoagulation. Dyslipidemia I did review his blood work which show me total cholesterol 98 HDL 28 continue present management   Medication Adjustments/Labs and Tests Ordered: Current medicines are reviewed at length with the patient today.  Concerns regarding medicines are outlined  above.  Orders Placed This Encounter  Procedures   EKG 12-Lead   Medication changes: No orders of the defined types were placed in this encounter.   Signed, [place] DOROTHA Bernie, MD, [facility] 12/06/2023 8:54 AM    [facility]

## 2024-03-18 ENCOUNTER — Other Ambulatory Visit: Payer: Self-pay | Admitting: Cardiology

## 2024-03-18 DIAGNOSIS — I824Y2 Acute embolism and thrombosis of unspecified deep veins of left proximal lower extremity: Secondary | ICD-10-CM

## 2024-03-21 NOTE — Telephone Encounter (Signed)
 Prescription refill request for Eliquis  received. Indication:pe/dvt Last office visit:8/25 Scr: 1.1  2025 Age:68 Weight:157.9  kg  Prescription refilled
